# Patient Record
Sex: Male | Born: 1935 | Race: White | Hispanic: No | Marital: Married | Smoking: Never smoker
Health system: Southern US, Community
[De-identification: ages and names within clinical notes are randomized; demographics above are authoritative.]

## PROBLEM LIST (undated history)

## (undated) DIAGNOSIS — M1990 Unspecified osteoarthritis, unspecified site: Secondary | ICD-10-CM

## (undated) DIAGNOSIS — I8390 Asymptomatic varicose veins of unspecified lower extremity: Secondary | ICD-10-CM

## (undated) DIAGNOSIS — C801 Malignant (primary) neoplasm, unspecified: Secondary | ICD-10-CM

## (undated) HISTORY — DX: Malignant (primary) neoplasm, unspecified: C80.1

## (undated) HISTORY — PX: VEIN SURGERY: SHX48

## (undated) HISTORY — DX: Asymptomatic varicose veins of unspecified lower extremity: I83.90

## (undated) HISTORY — DX: Unspecified osteoarthritis, unspecified site: M19.90

---

## 2007-12-22 DIAGNOSIS — C801 Malignant (primary) neoplasm, unspecified: Secondary | ICD-10-CM

## 2007-12-22 HISTORY — DX: Malignant (primary) neoplasm, unspecified: C80.1

## 2008-11-20 ENCOUNTER — Ambulatory Visit: Admission: RE | Admit: 2008-11-20 | Discharge: 2008-11-27 | Payer: Self-pay | Admitting: Radiation Oncology

## 2008-12-17 ENCOUNTER — Inpatient Hospital Stay: Payer: Self-pay | Admitting: General Surgery

## 2008-12-17 ENCOUNTER — Ambulatory Visit: Payer: Self-pay | Admitting: Anesthesiology

## 2008-12-17 HISTORY — PX: APPENDECTOMY: SHX54

## 2008-12-21 HISTORY — PX: VEIN SURGERY: SHX48

## 2009-01-30 ENCOUNTER — Ambulatory Visit: Admission: RE | Admit: 2009-01-30 | Discharge: 2009-03-27 | Payer: Self-pay | Admitting: Radiation Oncology

## 2009-03-08 ENCOUNTER — Encounter: Payer: Self-pay | Admitting: Radiation Oncology

## 2009-03-08 ENCOUNTER — Ambulatory Visit: Payer: Self-pay | Admitting: Vascular Surgery

## 2009-03-08 ENCOUNTER — Ambulatory Visit: Admission: RE | Admit: 2009-03-08 | Discharge: 2009-03-08 | Payer: Self-pay | Admitting: Radiation Oncology

## 2009-03-18 ENCOUNTER — Encounter: Admission: RE | Admit: 2009-03-18 | Discharge: 2009-03-18 | Payer: Self-pay | Admitting: Urology

## 2009-04-01 ENCOUNTER — Ambulatory Visit (HOSPITAL_BASED_OUTPATIENT_CLINIC_OR_DEPARTMENT_OTHER): Admission: RE | Admit: 2009-04-01 | Discharge: 2009-04-01 | Payer: Self-pay | Admitting: Urology

## 2009-04-25 ENCOUNTER — Ambulatory Visit: Admission: RE | Admit: 2009-04-25 | Discharge: 2009-06-14 | Payer: Self-pay | Admitting: Radiation Oncology

## 2010-03-19 ENCOUNTER — Ambulatory Visit: Payer: Self-pay | Admitting: Gastroenterology

## 2011-04-01 LAB — COMPREHENSIVE METABOLIC PANEL
BUN: 13 mg/dL (ref 6–23)
CO2: 26 mEq/L (ref 19–32)
Calcium: 8.7 mg/dL (ref 8.4–10.5)
Creatinine, Ser: 0.89 mg/dL (ref 0.4–1.5)
GFR calc non Af Amer: >60 mL/min (ref >60)
Glucose, Bld: 101 mg/dL — ABNORMAL HIGH (ref 70–99)
Total Bilirubin: 1.2 mg/dL (ref 0.3–1.2)

## 2011-04-01 LAB — CBC
HCT: 42.7 % (ref 39.0–52.0)
Hemoglobin: 14.4 g/dL (ref 13.0–17.0)
MCHC: 33.8 g/dL (ref 30.0–36.0)
MCV: 91.9 fL (ref 78.0–100.0)
RBC: 4.65 MIL/uL (ref 4.22–5.81)

## 2011-04-01 LAB — APTT: aPTT: 34 seconds (ref 24–37)

## 2011-04-01 LAB — PROTIME-INR: Prothrombin Time: 14 seconds (ref 11.6–15.2)

## 2011-05-05 NOTE — Op Note (Signed)
Jeffrey Kemp, Jeffrey Kemp                   ACCOUNT NO.:  [id number]   MEDICAL RECORD NO.:  [id number]          PATIENT TYPE:  AMB   LOCATION:  [facility]                         FACILITY:  [facility]   PHYSICIAN:  Bertram Millard. Dahlstedt, M.D.DATE OF BIRTH:  May 03, 1934   DATE OF PROCEDURE:  04/01/2009  DATE OF DISCHARGE:                               OPERATIVE REPORT   PREOPERATIVE DIAGNOSIS:  Adenocarcinoma of the prostate, clinical stage  T1C, Gleason 4+3.   POSTOPERATIVE DIAGNOSIS:  Adenocarcinoma of the prostate, clinical stage  T1C, Gleason 4+3.   PRINCIPAL PROCEDURE:  I-125 brachytherapy.   SURGEON:  Bertram Millard. Dahlstedt, M.D.   RADIATION ONCOLOGIST:  Artist Pais. Kathrynn Running, M.D.   ANESTHESIA:  General LMA.   COMPLICATIONS:  None.   BRIEF HISTORY:  A 77 year old male with adenocarcinoma of the prostate.  He completed external beam radiotherapy at the end of last month, and  presents at this time for brachytherapy to complete his combination  radiotherapy.  Risks and complications of the procedure have been  discussed with the patient.  He understands these and agrees to proceed.   DESCRIPTION OF PROCEDURE:  The patient was identified in the Holding  Area.  He received preoperative IV antibiotics and was taken to the  operating room, where general anesthetic was administered using the LMA.  He was placed in the dorsal lithotomy position.  Genitalia and perineum  were prepped and draped.  His bladder was catheterized with a Foley  catheter with contrast placed in the balloon.  Transrectal ultrasound  was performed with a probe placed by Dr. Kathrynn Running.  Fixation needles were  placed, following placement of the brachytherapy template against the  perineum.  A rectal catheter was placed as well.  Treatment planning was  performed, directed by Dr. Kathrynn Running.  At this point, I was called into  the room.  Time-out was then called.  Proper procedure, diagnosis,  antibiotics, and anesthesia were discussed.   According to the treatment  plan, a total of 69 seeds were delivered using 28 needles.  For  specifics of the procedure, please see the sonographic planning of  oncology treatment.  Following placement of all needles with their seeds  by the Nucletron device, the rectal probe, the fixation needles, and the  rectal tube were removed.  Fluoroscopic image of the seeds was then  obtained.  The Foley catheter was removed, and cystoscopic evaluation of  the bladder and urethra revealed no trauma or seeds within the urethra,  prostate, or the  bladder.  A very small amount of fluid was left in the bladder.  The  scope was removed.  The procedure was terminated.   The patient tolerated the procedure well.  He was awakened and then  taken to PACU in stable condition.      Bertram Millard. Dahlstedt, M.D.  Electronically Signed     SMD/MEDQ  D:  04/01/2009  T:  04/01/2009  Job:  [id number]   cc:   Artist Pais Kathrynn Running, M.D.  Fax: [phone]

## 2012-04-09 LAB — HM COLONOSCOPY

## 2013-02-06 ENCOUNTER — Ambulatory Visit (INDEPENDENT_AMBULATORY_CARE_PROVIDER_SITE_OTHER): Payer: Medicare Other | Admitting: Internal Medicine

## 2013-02-06 ENCOUNTER — Encounter: Payer: Self-pay | Admitting: Internal Medicine

## 2013-02-06 VITALS — BP 150/70 | HR 74 | Temp 98.6°F | Ht 66.75 in | Wt 189.0 lb

## 2013-02-06 DIAGNOSIS — Z8546 Personal history of malignant neoplasm of prostate: Secondary | ICD-10-CM

## 2013-02-06 DIAGNOSIS — L719 Rosacea, unspecified: Secondary | ICD-10-CM | POA: Insufficient documentation

## 2013-02-06 DIAGNOSIS — Z0000 Encounter for general adult medical examination without abnormal findings: Secondary | ICD-10-CM | POA: Insufficient documentation

## 2013-02-06 LAB — COMPREHENSIVE METABOLIC PANEL
ALT: 20 U/L (ref 0–53)
Alkaline Phosphatase: 91 U/L (ref 39–117)
Creatinine, Ser: 1 mg/dL (ref 0.4–1.5)
Sodium: 137 mEq/L (ref 135–145)
Total Bilirubin: 0.8 mg/dL (ref 0.3–1.2)
Total Protein: 7.6 g/dL (ref 6.0–8.3)

## 2013-02-06 LAB — CBC WITH DIFFERENTIAL/PLATELET
Basophils Absolute: 0 10*3/uL (ref 0.0–0.1)
Eosinophils Absolute: 0.2 10*3/uL (ref 0.0–0.7)
Hemoglobin: 14.9 g/dL (ref 13.0–17.0)
Lymphocytes Relative: 20.4 % (ref 12.0–46.0)
MCHC: 33.9 g/dL (ref 30.0–36.0)
Monocytes Relative: 11.1 % (ref 3.0–12.0)
Neutro Abs: 5 10*3/uL (ref 1.4–7.7)
Neutrophils Relative %: 65.6 % (ref 43.0–77.0)
RBC: 4.82 Mil/uL (ref 4.22–5.81)
RDW: 13.2 % (ref 11.5–14.6)

## 2013-02-06 LAB — LDL CHOLESTEROL, DIRECT: Direct LDL: 141.2 mg/dL

## 2013-02-06 LAB — LIPID PANEL
Cholesterol: 212 mg/dL — ABNORMAL HIGH (ref 0–200)
HDL: 37.1 mg/dL — ABNORMAL LOW (ref >39.00)
Total CHOL/HDL Ratio: 6
VLDL: 27 mg/dL (ref 0.0–40.0)

## 2013-02-06 NOTE — Assessment & Plan Note (Signed)
Will request records on previous evaluation and management.

## 2013-02-06 NOTE — Progress Notes (Signed)
  Subjective:    Patient ID: Jeffrey Kemp, male    DOB: [birth year], 79 y.o.   MRN: [id number]  HPI 79YO male with h/o prostate cancer presents to establish care. Doing well. Only concern today is h/o rosacea. Notes intermittent redness over his cheeks and occasional pustules over his face and neck. Not currently using anything for this. Otherwise feeling well. Follows healthy diet and exercises by gardening.   In regards to h/o prostate cancer, reports last PSA was <0.08. Was treated in past with XRT and seed radiation therapy.  No outpatient encounter prescriptions on file as of 02/06/2013.   No facility-administered encounter medications on file as of 02/06/2013.   BP 150/70  Pulse 74  Temp(Src) 98.6 F (37 C) (Oral)  Ht 5' 6.75" (1.695 m)  Wt 189 lb (85.73 kg)  BMI 29.84 kg/m2  SpO2 98%  Review of Systems  Constitutional: Negative for fever, chills, activity change, appetite change, fatigue and unexpected weight change.  Eyes: Negative for visual disturbance.  Respiratory: Negative for cough and shortness of breath.   Cardiovascular: Negative for chest pain, palpitations and leg swelling.  Gastrointestinal: Negative for abdominal pain and abdominal distention.  Genitourinary: Negative for dysuria, urgency and difficulty urinating.  Musculoskeletal: Negative for arthralgias and gait problem.  Skin: Positive for color change and rash.  Hematological: Negative for adenopathy.  Psychiatric/Behavioral: Negative for sleep disturbance and dysphoric mood. The patient is not nervous/anxious.        Objective:   Physical Exam  Constitutional: He is oriented to person, place, and time. He appears well-developed and well-nourished. No distress.  HENT:  Head: Normocephalic and atraumatic.  Right Ear: External ear normal.  Left Ear: External ear normal.  Nose: Nose normal.  Mouth/Throat: Oropharynx is clear and moist. No oropharyngeal exudate.  Eyes: Conjunctivae and EOM are normal.  Pupils are equal, round, and reactive to light. Right eye exhibits no discharge. Left eye exhibits no discharge. No scleral icterus.  Neck: Normal range of motion. Neck supple. No tracheal deviation present. No thyromegaly present.  Cardiovascular: Normal rate, regular rhythm and normal heart sounds.  Exam reveals no gallop and no friction rub.   No murmur heard. Pulmonary/Chest: Effort normal and breath sounds normal. No respiratory distress. He has no wheezes. He has no rales. He exhibits no tenderness.  Musculoskeletal: Normal range of motion. He exhibits no edema.  Lymphadenopathy:    He has no cervical adenopathy.  Neurological: He is alert and oriented to person, place, and time. No cranial nerve deficit. Coordination normal.  Skin: Skin is warm and dry. Rash noted. Rash is maculopapular. He is not diaphoretic. There is erythema. No pallor.  Psychiatric: He has a normal mood and affect. His behavior is normal. Judgment and thought content normal.          Assessment & Plan:

## 2013-02-06 NOTE — Assessment & Plan Note (Signed)
General medical exam normal today. Will check basic labs including CMP, CBC, lipids. Baseline EKG. Will request records from previous PCP.

## 2013-02-06 NOTE — Assessment & Plan Note (Signed)
Discussed using topical metronidazole cream. Pt declined and would like to just monitor symptoms for now.

## 2013-02-07 NOTE — Progress Notes (Signed)
Patient informed and verbally agreed understanding.  

## 2013-10-09 ENCOUNTER — Ambulatory Visit (INDEPENDENT_AMBULATORY_CARE_PROVIDER_SITE_OTHER): Payer: Medicare Other | Admitting: *Deleted

## 2013-10-09 DIAGNOSIS — Z23 Encounter for immunization: Secondary | ICD-10-CM

## 2013-10-18 ENCOUNTER — Telehealth: Payer: Self-pay | Admitting: Internal Medicine

## 2013-10-18 NOTE — Telephone Encounter (Signed)
Called and spoke with patient, he was a little agitated because he had been hung up on but was apologetic. Per patient he has a swollen painful great toe and he has had this before. Last time it was thought to be gout but per patient it turned out he had a clot. Advised patient to go to the ED to be evaluated patient refused, "I will just deal with it because I am not going to the emergency room". Explained to him that he needs to be evaluated and we can not treat him over the phone as well as it is late in the day. He continued to ask if Dr. Dan Humphreys could give him the same thing he was given before to treat this. He agreed to come in to see Raquel tomorrow in reference to this, because he said he has had all this done before and he knows what it is.

## 2013-10-18 NOTE — Telephone Encounter (Signed)
Pt call his toe on left foot is swollen on side and painful and red.   Pt stated he is taking ibuprofen for pain.  Pt stated last time he had this it was a blood clot in leg. Sent to traige

## 2013-10-18 NOTE — Telephone Encounter (Signed)
Printed and put on carols desk

## 2013-10-19 ENCOUNTER — Encounter: Payer: Self-pay | Admitting: Adult Health

## 2013-10-19 ENCOUNTER — Ambulatory Visit: Payer: Self-pay | Admitting: Adult Health

## 2013-10-19 ENCOUNTER — Other Ambulatory Visit: Payer: Self-pay | Admitting: Adult Health

## 2013-10-19 ENCOUNTER — Ambulatory Visit (INDEPENDENT_AMBULATORY_CARE_PROVIDER_SITE_OTHER): Payer: Medicare Other | Admitting: Adult Health

## 2013-10-19 ENCOUNTER — Encounter (INDEPENDENT_AMBULATORY_CARE_PROVIDER_SITE_OTHER): Payer: Self-pay

## 2013-10-19 ENCOUNTER — Telehealth: Payer: Self-pay | Admitting: *Deleted

## 2013-10-19 VITALS — BP 160/80 | HR 67 | Temp 98.0°F | Resp 12 | Wt 199.0 lb

## 2013-10-19 DIAGNOSIS — R609 Edema, unspecified: Secondary | ICD-10-CM

## 2013-10-19 DIAGNOSIS — R600 Localized edema: Secondary | ICD-10-CM

## 2013-10-19 MED ORDER — MELOXICAM 7.5 MG PO TABS: 7.5000 mg | ORAL_TABLET | Freq: Every day | ORAL | Status: DC

## 2013-10-19 NOTE — Assessment & Plan Note (Signed)
Send for LLE doppler to r/o DVT.

## 2013-10-19 NOTE — Progress Notes (Signed)
  No DVT   Treat for gout - Meloxicam 7.5 mg daily x 14 days.  Elevate foot

## 2013-10-19 NOTE — Progress Notes (Signed)
  Subjective:    Patient ID: JERYL UMHOLTZ, male    DOB: [birth year], 79 y.o.   MRN: [id number]  HPI  Pt presents with redness and swelling in left foot since Sunday. Pt states states he has the same symptoms in 2010 and was told he had a blood clot and was started on Coumadin.  He is requesting evaluation. Denies hx of gout. He reports that, initially, he was treated for gout but it was a DVT.    Review of Systems  Respiratory: Negative.   Cardiovascular: Positive for leg swelling.       Edema of left foot  Skin: Positive for color change.       Redness, left foot       Objective:   Physical Exam  Constitutional: He is oriented to person, place, and time. He appears well-developed and well-nourished. No distress.  Cardiovascular: Normal rate and regular rhythm.   Pulmonary/Chest: Effort normal. No respiratory distress.  Musculoskeletal: He exhibits edema.  Neurological: He is alert and oriented to person, place, and time.  Skin: No rash noted. There is erythema.  Psychiatric: He has a normal mood and affect. His behavior is normal. Judgment and thought content normal.          Assessment & Plan:

## 2013-10-19 NOTE — Telephone Encounter (Signed)
Preliminary report from [facility] called in is Negative for DVT. Patient would like to know plan of treatment if it is Gout.

## 2013-10-20 NOTE — Telephone Encounter (Signed)
PT was notified of normal doppler, advised that Raquel has sent in Meloxicam daily x 14 days, verbalized understanding.

## 2013-10-23 NOTE — Telephone Encounter (Signed)
Advised pt Meloxicam is for gout and to call office if it persists.

## 2013-10-23 NOTE — Telephone Encounter (Signed)
Pt came in today wanting to know what he is being treated for.  He stated he didn't have blood clot. His meds was for anti inflammatory meds

## 2013-11-06 ENCOUNTER — Encounter: Payer: Self-pay | Admitting: Adult Health

## 2013-11-08 ENCOUNTER — Telehealth: Payer: Self-pay | Admitting: Internal Medicine

## 2013-11-08 NOTE — Telephone Encounter (Signed)
Pt states he had gout.  Is asking if there is something he can take for this.  States his toe is still swollen but is better.  Has heard there is something he can take continuously to keep this from happening in the future.  Would like to know his options and prices.

## 2013-11-09 NOTE — Telephone Encounter (Signed)
Please advise 

## 2013-11-09 NOTE — Telephone Encounter (Signed)
Patient informed and appointment scheduled for 11/25 at 830

## 2013-11-09 NOTE — Telephone Encounter (Signed)
Needs to be seen

## 2013-11-14 ENCOUNTER — Ambulatory Visit (INDEPENDENT_AMBULATORY_CARE_PROVIDER_SITE_OTHER): Payer: Medicare Other | Admitting: Internal Medicine

## 2013-11-14 ENCOUNTER — Encounter: Payer: Self-pay | Admitting: Internal Medicine

## 2013-11-14 VITALS — BP 150/68 | Temp 97.7°F

## 2013-11-14 DIAGNOSIS — M25473 Effusion, unspecified ankle: Secondary | ICD-10-CM

## 2013-11-14 DIAGNOSIS — R600 Localized edema: Secondary | ICD-10-CM

## 2013-11-14 DIAGNOSIS — L989 Disorder of the skin and subcutaneous tissue, unspecified: Secondary | ICD-10-CM

## 2013-11-14 DIAGNOSIS — M25472 Effusion, left ankle: Secondary | ICD-10-CM

## 2013-11-14 DIAGNOSIS — R609 Edema, unspecified: Secondary | ICD-10-CM

## 2013-11-14 LAB — COMPREHENSIVE METABOLIC PANEL
ALT: 17 U/L (ref 0–53)
AST: 19 U/L (ref 0–37)
Albumin: 3.3 g/dL — ABNORMAL LOW (ref 3.5–5.2)
Alkaline Phosphatase: 85 U/L (ref 39–117)
BUN: 15 mg/dL (ref 6–23)
Calcium: 8.9 mg/dL (ref 8.4–10.5)
Chloride: 105 mEq/L (ref 96–112)
Potassium: 4.5 mEq/L (ref 3.5–5.1)
Sodium: 138 mEq/L (ref 135–145)
Total Protein: 6.8 g/dL (ref 6.0–8.3)

## 2013-11-14 NOTE — Progress Notes (Signed)
  Subjective:    Patient ID: Jeffrey Kemp, male    DOB: Sep 07, 1934, 79 y.o.   MRN: [id number]  HPI 79YO male presents for follow up recent left ankle pain and swelling. First developed left ankle and foot pain about 3-4 weeks ago. Was seen in clinic, concern for DVT, so had LE doppler which was negative. Pain persisted and he went to urgent care. Started on Prednisone for possible gout. Notes improvement in the pain since that time. No labs were checked. Also notes skin lesion over medial left ankle, which has been present for >1 year. The skin lesion is not painful or itchy. It has not changed in size  Outpatient Encounter Prescriptions as of 11/14/2013  Medication Sig  . meloxicam (MOBIC) 7.5 MG tablet Take 1 tablet (7.5 mg total) by mouth daily.   BP 150/68  Temp(Src) 97.7 F (36.5 C)  Review of Systems  Constitutional: Negative for fever and chills.  Respiratory: Negative for shortness of breath.   Cardiovascular: Positive for leg swelling. Negative for chest pain.  Musculoskeletal: Positive for arthralgias and joint swelling. Negative for gait problem.  Skin: Positive for color change (initially had erythema over left foot and ankle, now improving.) and wound (chronic left medial ankle).  Neurological: Negative for weakness.       Objective:   Physical Exam  Constitutional: He is oriented to person, place, and time. He appears well-developed and well-nourished. No distress.  HENT:  Head: Normocephalic and atraumatic.  Right Ear: External ear normal.  Left Ear: External ear normal.  Nose: Nose normal.  Mouth/Throat: Oropharynx is clear and moist. No oropharyngeal exudate.  Eyes: Conjunctivae and EOM are normal. Pupils are equal, round, and reactive to light. Right eye exhibits no discharge. Left eye exhibits no discharge. No scleral icterus.  Neck: Normal range of motion. Neck supple. No tracheal deviation present. No thyromegaly present.  Cardiovascular: Normal rate, regular  rhythm and normal heart sounds.  Exam reveals no gallop and no friction rub.   No murmur heard. Pulmonary/Chest: Effort normal and breath sounds normal. No respiratory distress. He has no wheezes. He has no rales. He exhibits no tenderness.  Musculoskeletal: Normal range of motion. He exhibits no edema.       Left foot: He exhibits tenderness (mild diffuse over ankle and great toe) and swelling (trace left ankle). He exhibits normal range of motion and no bony tenderness.       Feet:  Lymphadenopathy:    He has no cervical adenopathy.  Neurological: He is alert and oriented to person, place, and time. No cranial nerve deficit. Coordination normal.  Skin: Skin is warm and dry. No rash noted. He is not diaphoretic. No erythema. No pallor.  Psychiatric: He has a normal mood and affect. His behavior is normal. Judgment and thought content normal.          Assessment & Plan:

## 2013-11-14 NOTE — Assessment & Plan Note (Addendum)
Persistent, but improving left ankle and foot pain and swelling. LE was negative for DVT. Pt was seen at urgent care and started on Prednisone with improvement. Will check CMP and uric acid level today. Consider adding allopurinol in the future. For now, pain described as minimal and pt does not want to add any additional medication. Given persistent left ankle pain, will also check plain xray. Follow up 2 weeks.

## 2013-11-14 NOTE — Assessment & Plan Note (Signed)
Lesion is most consistent with chronic ulceration secondary to edema however given persistence, will set up dermatology evaluation for possible biopsy.

## 2013-11-20 ENCOUNTER — Telehealth: Payer: Self-pay | Admitting: *Deleted

## 2013-11-20 NOTE — Telephone Encounter (Signed)
Patient called wanting to know if you received the results from his visit with Dr. Gwen Pounds. He saw him today and would like to know if he should still have the x-ray done tomorrow. Informed patient Dr. Dan Humphreys was not in the office this afternoon and it would be tomorrow before she could check visit notes. Per patient he will go ahead get the Xray done.

## 2013-11-20 NOTE — Telephone Encounter (Signed)
I have not yet seen notes from Dr. Gwen Pounds

## 2013-11-21 ENCOUNTER — Ambulatory Visit (INDEPENDENT_AMBULATORY_CARE_PROVIDER_SITE_OTHER)
Admission: RE | Admit: 2013-11-21 | Discharge: 2013-11-21 | Disposition: A | Discharge status: Home / Self Care | Payer: Medicare Other | Source: Ambulatory Visit | Attending: Internal Medicine | Admitting: Internal Medicine

## 2013-11-21 DIAGNOSIS — M25473 Effusion, unspecified ankle: Secondary | ICD-10-CM

## 2013-11-21 DIAGNOSIS — M25472 Effusion, left ankle: Secondary | ICD-10-CM

## 2013-11-21 NOTE — Telephone Encounter (Signed)
No I have not

## 2013-11-21 NOTE — Telephone Encounter (Signed)
Spoke with patient and he would prefer to wait 6 weeks with the plan of Dr. Gwen Pounds. Because Dr. Gwen Pounds also told him if that does not work then he should be seen by vascular.

## 2013-11-21 NOTE — Telephone Encounter (Signed)
Have you received the notes?

## 2013-11-21 NOTE — Telephone Encounter (Signed)
OK. We can always set up a vascular evaluation with doppler if needed.

## 2013-11-21 NOTE — Telephone Encounter (Signed)
Called and spoke with patient, he state his leg is still swollen. But when he saw Dr. Gwen Pounds yesterday he told him he has vascular problems that is why he is having swelling in his leg. He is going to have him wear compression stockings and aspirin for 6 weeks to see how that does.

## 2013-12-04 ENCOUNTER — Telehealth: Payer: Self-pay | Admitting: *Deleted

## 2013-12-04 NOTE — Telephone Encounter (Signed)
Patient left a voicemail stating he had a missed call from the office and he was returning the call. Called and spoke with patient, informed him I did not see anything in his chart about someone calling him. However an automated system is calling patients to remind them about some overdue health maintenance such as immunizations and colonoscopy. Per patient he will address this when he comes in for his yearly exam at the beginning of the year.

## 2014-01-26 ENCOUNTER — Ambulatory Visit (INDEPENDENT_AMBULATORY_CARE_PROVIDER_SITE_OTHER): Payer: Medicare HMO | Admitting: Internal Medicine

## 2014-01-26 ENCOUNTER — Encounter (INDEPENDENT_AMBULATORY_CARE_PROVIDER_SITE_OTHER): Payer: Self-pay

## 2014-01-26 ENCOUNTER — Encounter: Payer: Self-pay | Admitting: Internal Medicine

## 2014-01-26 VITALS — BP 144/78 | HR 78 | Temp 98.1°F | Wt 201.0 lb

## 2014-01-26 DIAGNOSIS — R1031 Right lower quadrant pain: Secondary | ICD-10-CM

## 2014-01-26 DIAGNOSIS — I739 Peripheral vascular disease, unspecified: Secondary | ICD-10-CM

## 2014-01-26 NOTE — Progress Notes (Signed)
   Subjective:    Patient ID: Jeffrey Kemp, male    DOB: [birth year], 80 y.o.   MRN: [id number]  HPI 80 year old male presents for followup of left foot pain. He reports persistent pain in his left foot and occasional discoloration of his foot described as purplish color. He notes that his foot occasionally feels cold. When he walks for a prolonged period of time he develops cramping and pain in his left calf. He denies swelling in his leg.  He is also concerned about pain in his right groin. This is been ongoing for several weeks. It is described as intermittent and sharp pain associated with movement. He has not noticed any bulging or mass in the area. He denies trauma to his groin.  Review of Systems  Constitutional: Negative for fever, chills, activity change, appetite change, fatigue and unexpected weight change.  Eyes: Negative for visual disturbance.  Respiratory: Negative for cough and shortness of breath.   Cardiovascular: Negative for chest pain, palpitations and leg swelling.  Gastrointestinal: Positive for abdominal pain (right groin pain). Negative for nausea, vomiting, diarrhea, constipation and abdominal distention.  Genitourinary: Negative for dysuria, urgency, frequency, hematuria, flank pain, penile swelling, scrotal swelling, difficulty urinating, genital sores, penile pain and testicular pain.  Musculoskeletal: Positive for arthralgias and myalgias. Negative for gait problem.  Skin: Positive for color change. Negative for rash.  Hematological: Negative for adenopathy.  Psychiatric/Behavioral: Negative for sleep disturbance and dysphoric mood. The patient is not nervous/anxious.        Objective:    BP 144/78  Pulse 78  Temp(Src) 98.1 F (36.7 C) (Oral)  Wt 201 lb (91.173 kg)  SpO2 97% Physical Exam  Constitutional: He is oriented to person, place, and time. He appears well-developed and well-nourished. No distress.  HENT:  Head: Normocephalic and atraumatic.  Right  Ear: External ear normal.  Left Ear: External ear normal.  Nose: Nose normal.  Mouth/Throat: Oropharynx is clear and moist. No oropharyngeal exudate.  Eyes: Conjunctivae and EOM are normal. Pupils are equal, round, and reactive to light. Right eye exhibits no discharge. Left eye exhibits no discharge. No scleral icterus.  Neck: Normal range of motion. Neck supple. No tracheal deviation present. No thyromegaly present.  Cardiovascular: Normal rate, regular rhythm and normal heart sounds.  Exam reveals no gallop and no friction rub.   No murmur heard. Pulmonary/Chest: Effort normal and breath sounds normal. No accessory muscle usage. Not tachypneic. No respiratory distress. He has no decreased breath sounds. He has no wheezes. He has no rhonchi. He has no rales. He exhibits no tenderness.  Abdominal: Soft. Normal appearance and bowel sounds are normal. There is tenderness.    Musculoskeletal: Normal range of motion. He exhibits no edema.       Feet:  Lymphadenopathy:    He has no cervical adenopathy.  Neurological: He is alert and oriented to person, place, and time. No cranial nerve deficit. Coordination normal.  Skin: Skin is warm and dry. No rash noted. He is not diaphoretic. No erythema. No pallor.  Psychiatric: He has a normal mood and affect. His behavior is normal. Judgment and thought content normal.          Assessment & Plan:   Problem List Items Addressed This Visit   None       No Follow-up on file.

## 2014-01-26 NOTE — Assessment & Plan Note (Signed)
Symptoms in left foot and leg are most consistent with claudication. Will set up vascular evaluation for ABI and arterial Doppler. Continue aspirin.

## 2014-01-26 NOTE — Progress Notes (Signed)
Pre-visit discussion using our clinic review tool. No additional management support is needed unless otherwise documented below in the visit note.  

## 2014-01-26 NOTE — Assessment & Plan Note (Signed)
Right groin pain most consistent with right inguinal hernia. Will set up evaluation with general surgery.

## 2014-01-29 ENCOUNTER — Encounter: Payer: Self-pay | Admitting: *Deleted

## 2014-02-01 ENCOUNTER — Ambulatory Visit (INDEPENDENT_AMBULATORY_CARE_PROVIDER_SITE_OTHER): Payer: Medicare HMO | Admitting: General Surgery

## 2014-02-01 ENCOUNTER — Encounter: Payer: Self-pay | Admitting: General Surgery

## 2014-02-01 ENCOUNTER — Ambulatory Visit: Payer: Self-pay | Admitting: General Surgery

## 2014-02-01 VITALS — BP 174/84 | HR 74 | Resp 12 | Ht 69.0 in | Wt 202.0 lb

## 2014-02-01 DIAGNOSIS — R1031 Right lower quadrant pain: Secondary | ICD-10-CM

## 2014-02-01 DIAGNOSIS — M7989 Other specified soft tissue disorders: Secondary | ICD-10-CM

## 2014-02-01 MED ORDER — MELOXICAM 7.5 MG PO TABS: 7.5000 mg | ORAL_TABLET | Freq: Every day | ORAL | Status: DC

## 2014-02-01 NOTE — Patient Instructions (Signed)
Rx for Mobic 7.5mg  daily for 1-2 weeks. If therapy is unsuccessful for 1 week, CT scan.  Patient also to return for duplex study of lower extremity veins.

## 2014-02-01 NOTE — Progress Notes (Signed)
Patient ID: Jeffrey Kemp, male   DOB: [birth year], 80 y.o.   MRN: [id number]  Chief Complaint  Patient presents with  . Other    right groin pain    HPI Jeffrey Kemp is a 80 y.o. male here today for an evaluation of right groin pain that began 6 months ago. Pain has began to increase in intensity in the past two weeks.  Patient states the pain is intermittent.  HPI  Past Medical History  Diagnosis Date  . Cancer 2009    Prostate, s/p XRT    Past Surgical History  Procedure Laterality Date  . Vein surgery    . Appendectomy  12/17/2008    Jeffrey    Family History  Problem Relation Age of Onset  . Heart disease Mother   . Heart disease Brother   . Cancer Brother     Colon  . Bipolar disorder Daughter   . Heart disease Maternal Uncle     Social History History  Substance Use Topics  . Smoking status: Never Smoker   . Smokeless tobacco: Never Used  . Alcohol Use: No    No Known Allergies  Current Outpatient Prescriptions  Medication Sig Dispense Refill  . aspirin 81 MG tablet Take 81 mg by mouth daily.      . meloxicam (MOBIC) 7.5 MG tablet Take 1 tablet (7.5 mg total) by mouth daily.  30 tablet  0   No current facility-administered medications for this visit.    Review of Systems Review of Systems  Constitutional: Negative.   Respiratory: Negative.   Cardiovascular: Negative.     Blood pressure 174/84, pulse 74, resp. rate 12, height 5\' 9"  (1.753 m), weight 202 lb (91.627 kg).  Physical Exam Physical Exam  Constitutional: He is oriented to person, place, and time. He appears well-developed and well-nourished.  Eyes: Conjunctivae are normal.  Cardiovascular: Normal rate, regular rhythm and normal heart sounds.   Pulses:      Dorsalis pedis pulses are 2+ on the right side, and 0 on the left side.       Posterior tibial pulses are 2+ on the right side, and 2+ on the left side.  1+ edema bilaterally. Feet are warm with brisk capillary refill. Left big toe with  no apparent findings.  Pulmonary/Chest: Effort normal and breath sounds normal.  Abdominal: Soft. Normal appearance. There is tenderness (right inguinal region). No hernia.  Neurological: He is alert and oriented to person, place, and time.  Skin: Skin is warm and dry.    Data Reviewed None  Assessment    No hernia noted in the right groin.  Mild edema in legs in lower third. No apparent cause. No arterial insufficiency noted.     Plan    Rx for Mobic 7.5mg  daily for 1-2 weeks. If therapy is unsuccessful for 1 week, CT scan.  Patient also to return for duplex study of lower extremity veins.        Jeffrey,Kemp G 02/02/2014, 8:29 AM

## 2014-02-02 ENCOUNTER — Encounter: Payer: Self-pay | Admitting: General Surgery

## 2014-02-20 ENCOUNTER — Encounter: Payer: Medicare Other | Admitting: Internal Medicine

## 2014-02-22 ENCOUNTER — Encounter: Payer: Self-pay | Admitting: General Surgery

## 2014-02-22 ENCOUNTER — Ambulatory Visit (INDEPENDENT_AMBULATORY_CARE_PROVIDER_SITE_OTHER): Payer: Medicare HMO | Admitting: General Surgery

## 2014-02-22 VITALS — BP 154/68 | HR 78 | Resp 14 | Ht 69.0 in | Wt 200.0 lb

## 2014-02-22 DIAGNOSIS — R600 Localized edema: Secondary | ICD-10-CM

## 2014-02-22 DIAGNOSIS — R609 Edema, unspecified: Secondary | ICD-10-CM

## 2014-02-22 NOTE — Patient Instructions (Signed)
Patient advised to continue to use compression stockings. Patient to return as needed. The patient is aware to call back for any questions or concerns.

## 2014-02-22 NOTE — Progress Notes (Addendum)
Patient ID: Jeffrey Kemp, male   DOB: [birth year], 80 y.o.   MRN: [id number]  Patient presents for a venous ultrasound. The patient has a history of varicose veins. He states he wears his compression stocking on a daily basis. No new problems at this time. The patient was seen two weeks ago for right groin pain.  He states the groin pain is improved.  Exam shows again no hernia in right groin, no lymphadenopathy. Venous duplex study was completed today.  Has mild insufficiency of left femoral vein. Pt advised. He is to continue using compression hose. Call for any worsening of edema or leg symptoms.  Also call if his groin pain recurs or worsens.  Lower extremity venous Duplex study. Bilateral lower extremity venous duplex study was completed today. The greater saphenous vein is not identified on the right or left. The lesser saphenous vein is normal on both sides. The right common femoral vein and femoral vein are normal. The left common femoral vein has a mild abnormal reflux 0.9 seconds. Both popliteal veins are normal Impression: Mild deep venous insufficiency involving the left common femoral vein.

## 2014-02-23 ENCOUNTER — Encounter: Payer: Self-pay | Admitting: General Surgery

## 2014-02-23 DIAGNOSIS — R600 Localized edema: Secondary | ICD-10-CM | POA: Insufficient documentation

## 2014-03-02 ENCOUNTER — Other Ambulatory Visit: Payer: Self-pay | Admitting: *Deleted

## 2014-03-02 ENCOUNTER — Other Ambulatory Visit: Payer: Medicare HMO

## 2014-03-02 DIAGNOSIS — I872 Venous insufficiency (chronic) (peripheral): Secondary | ICD-10-CM

## 2014-03-07 ENCOUNTER — Telehealth: Payer: Self-pay | Admitting: Emergency Medicine

## 2014-03-07 NOTE — Telephone Encounter (Signed)
Referral underway for [employer] to [facility]

## 2014-03-13 NOTE — Telephone Encounter (Signed)
No referral needed as [facility] is [facility] per [facility]

## 2014-04-09 ENCOUNTER — Ambulatory Visit (INDEPENDENT_AMBULATORY_CARE_PROVIDER_SITE_OTHER): Payer: Commercial Managed Care - HMO | Admitting: Internal Medicine

## 2014-04-09 ENCOUNTER — Encounter: Payer: Self-pay | Admitting: Internal Medicine

## 2014-04-09 VITALS — BP 150/80 | HR 68 | Temp 98.1°F | Ht 67.25 in | Wt 201.0 lb

## 2014-04-09 DIAGNOSIS — L719 Rosacea, unspecified: Secondary | ICD-10-CM

## 2014-04-09 DIAGNOSIS — Z23 Encounter for immunization: Secondary | ICD-10-CM | POA: Insufficient documentation

## 2014-04-09 DIAGNOSIS — M2550 Pain in unspecified joint: Secondary | ICD-10-CM

## 2014-04-09 DIAGNOSIS — G4700 Insomnia, unspecified: Secondary | ICD-10-CM

## 2014-04-09 DIAGNOSIS — Z0000 Encounter for general adult medical examination without abnormal findings: Secondary | ICD-10-CM | POA: Insufficient documentation

## 2014-04-09 LAB — CBC WITH DIFFERENTIAL/PLATELET
Basophils Absolute: 0 10*3/uL (ref 0.0–0.1)
Basophils Relative: 0.7 % (ref 0.0–3.0)
EOS PCT: 2.7 % (ref 0.0–5.0)
Eosinophils Absolute: 0.2 10*3/uL (ref 0.0–0.7)
HCT: 41.4 % (ref 39.0–52.0)
Hemoglobin: 13.9 g/dL (ref 13.0–17.0)
Lymphocytes Relative: 23.1 % (ref 12.0–46.0)
Lymphs Abs: 1.4 10*3/uL (ref 0.7–4.0)
MCHC: 33.7 g/dL (ref 30.0–36.0)
MCV: 92.2 fl (ref 78.0–100.0)
MONO ABS: 0.7 10*3/uL (ref 0.1–1.0)
MONOS PCT: 11.7 % (ref 3.0–12.0)
NEUTROS PCT: 61.8 % (ref 43.0–77.0)
Neutro Abs: 3.7 10*3/uL (ref 1.4–7.7)
PLATELETS: 234 10*3/uL (ref 150.0–400.0)
RBC: 4.49 Mil/uL (ref 4.22–5.81)
RDW: 13.6 % (ref 11.5–14.6)
WBC: 6 10*3/uL (ref 4.5–10.5)

## 2014-04-09 LAB — PSA, MEDICARE: PSA: 0.01 ng/mL — AB (ref 0.10–4.00)

## 2014-04-09 LAB — COMPREHENSIVE METABOLIC PANEL
ALBUMIN: 3.3 g/dL — AB (ref 3.5–5.2)
ALK PHOS: 78 U/L (ref 39–117)
ALT: 17 U/L (ref 0–53)
AST: 64 U/L — ABNORMAL HIGH (ref 0–37)
BUN: 16 mg/dL (ref 6–23)
CO2: 26 meq/L (ref 19–32)
Calcium: 9 mg/dL (ref 8.4–10.5)
Chloride: 105 mEq/L (ref 96–112)
Creatinine, Ser: 0.9 mg/dL (ref 0.4–1.5)
GFR: 89 mL/min (ref >60.00)
GLUCOSE: 92 mg/dL (ref 70–99)
POTASSIUM: 4.6 meq/L (ref 3.5–5.1)
SODIUM: 138 meq/L (ref 135–145)
TOTAL PROTEIN: 6.7 g/dL (ref 6.0–8.3)
Total Bilirubin: 0.9 mg/dL (ref 0.3–1.2)

## 2014-04-09 LAB — MICROALBUMIN / CREATININE URINE RATIO
CREATININE, U: 159.5 mg/dL
MICROALB UR: 0.5 mg/dL (ref 0.0–1.9)
MICROALB/CREAT RATIO: 0.3 mg/g (ref 0.0–30.0)

## 2014-04-09 LAB — LIPID PANEL
CHOLESTEROL: 203 mg/dL — AB (ref 0–200)
HDL: 35.1 mg/dL — ABNORMAL LOW (ref >39.00)
LDL CALC: 132 mg/dL — AB (ref 0–99)
Total CHOL/HDL Ratio: 6
Triglycerides: 181 mg/dL — ABNORMAL HIGH (ref 0.0–149.0)
VLDL: 36.2 mg/dL (ref 0.0–40.0)

## 2014-04-09 MED ORDER — METRONIDAZOLE 1 % EX GEL: Freq: Every day | CUTANEOUS | Status: DC

## 2014-04-09 MED ORDER — MELOXICAM 15 MG PO TABS: 15.0000 mg | ORAL_TABLET | Freq: Every day | ORAL | Status: DC

## 2014-04-09 MED ORDER — ZALEPLON 5 MG PO CAPS: 5.0000 mg | ORAL_CAPSULE | Freq: Every evening | ORAL | Status: DC | PRN

## 2014-04-09 NOTE — Progress Notes (Signed)
Pre visit review using our clinic review tool, if applicable. No additional management support is needed unless otherwise documented below in the visit note. 

## 2014-04-09 NOTE — Progress Notes (Signed)
The patient is here for annual Medicare Wellness Examination and management of other chronic and acute problems.   The risk factors are reflected in the history.  The roster of all physicians providing medical care to patient - is listed in the Snapshot section of the chart.  Activities of daily living:   The patient is 100% independent in all ADLs: dressing, toileting, feeding as well as independent mobility. Patient lives with wife. No pets. One story.  Home safety :  The patient has smoke detectors in the home.  They wear seatbelts in their car. There are no firearms at home.  There is no violence in the home. They feel safe where they live.  Infectious Risks: There is no risks for hepatitis, STDs or HIV.  There is no  history of blood transfusion.  They have no travel history to infectious disease endemic areas of the world.  Additional Health Care Providers: The patient has seen their dentist in the last six months.  [facility] - [facility] They have seen their eye doctor in the last year. Scheduled for cataract removal in May and June. Opthalmologist - Dr. Linton Flemings They deny hearing issues. They have deferred audiologic testing in the last year.   They do not  have excessive sun exposure. Discussed the need for sun protection: hats,long sleeves and use of sunscreen if there is significant sun exposure.  Dermatologist - Dr. Nehemiah Massed  Diet: the importance of a healthy diet is discussed. They do have a healthy diet.  The benefits of regular aerobic exercise were discussed. Patient is physically active, but no scheduled exercise.  Depression screen: there are no signs or vegative symptoms of depression- irritability, change in appetite, anhedonia, sadness/tearfullness. Notes occasional depressed mood recently, but attributes this to inability to see well, with bilateral cataracts, diffuse joint pain, and trouble sleeping. Previously enjoyed watching baseball on TV, but has been unable  to recently.  Cognitive assessment: the patient manages all their financial and personal affairs and is actively engaged.   HCPOA - wife, Joaquin Knebel   The following portions of the patient's history were reviewed and updated as appropriate: allergies, current medications, past family history, past medical history,  past surgical history, past social history and problem list.  Visual acuity was not assessed per patient preference as they have regular follow up with their ophthalmologist. Hearing and body mass index were assessed and reviewed.   During the course of the visit the patient was educated and counseled about appropriate screening and preventive services including : fall prevention , diabetes screening, nutrition counseling, colorectal cancer screening, and recommended immunizations.    Concerned about diffuse arthralgia, particulary in right ankle. Takes occasional ibuprofen with no improvement.  Also not sleeping well. Falls asleep but then wakes 2-3 hr later and cannot go back to sleep.  Review of Systems  Constitutional: Negative for fever, chills, activity change, appetite change, fatigue and unexpected weight change.  Eyes: Negative for visual disturbance.  Respiratory: Negative for cough and shortness of breath.   Cardiovascular: Negative for chest pain, palpitations and leg swelling.  Gastrointestinal: Negative for abdominal pain and abdominal distention.  Genitourinary: Negative for dysuria, urgency and difficulty urinating.  Musculoskeletal: Positive for arthralgias and joint swelling. Negative for gait problem.  Skin: Negative for color change and rash.  Hematological: Negative for adenopathy.  Psychiatric/Behavioral: Positive for sleep disturbance and dysphoric mood. The patient is not nervous/anxious.        Objective:    BP 150/80  Pulse 68  Temp(Src) 98.1 F (36.7 C) (Oral)  Ht 5' 7.25" (1.708 m)  Wt 201 lb (91.173 kg)  BMI 31.25 kg/m2  SpO2  97% Physical Exam  Constitutional: He is oriented to person, place, and time. He appears well-developed and well-nourished. No distress.  HENT:  Head: Normocephalic and atraumatic.  Right Ear: External ear normal.  Left Ear: External ear normal.  Nose: Nose normal.  Mouth/Throat: Oropharynx is clear and moist. No oropharyngeal exudate.  Eyes: Conjunctivae and EOM are normal. Pupils are equal, round, and reactive to light. Right eye exhibits no discharge. Left eye exhibits no discharge. No scleral icterus.  Neck: Normal range of motion. Neck supple. No tracheal deviation present. No thyromegaly present.  Cardiovascular: Normal rate, regular rhythm and normal heart sounds.  Exam reveals no gallop and no friction rub.   No murmur heard. Pulmonary/Chest: Effort normal and breath sounds normal. No accessory muscle usage. Not tachypneic. No respiratory distress. He has no decreased breath sounds. He has no wheezes. He has no rhonchi. He has no rales. He exhibits no tenderness.  Abdominal: Soft. Bowel sounds are normal. There is no tenderness.  Musculoskeletal: Normal range of motion. He exhibits no edema.       Left ankle: He exhibits swelling (non-pitting edema noted over ankle joint). He exhibits normal range of motion and no deformity. No tenderness.  Lymphadenopathy:    He has no cervical adenopathy.  Neurological: He is alert and oriented to person, place, and time. No cranial nerve deficit. Coordination normal.  Skin: Skin is warm and dry. No rash noted. He is not diaphoretic. No erythema. No pallor.  Psychiatric: He has a normal mood and affect. His behavior is normal. Judgment and thought content normal.          Assessment & Plan:   Problem List Items Addressed This Visit   Arthralgia     Diffuse arthralgia most prominent in left ankle. Most consistent with OA. Reviewed recent plain film of left ankle which was normal. Will restart Meloxicam as this worked well for him in the past.  Discussed referral to rheumatology if symptoms are not improving.    Relevant Medications      meloxicam (MOBIC) tablet   Insomnia     Recent difficulty staying asleep. Will start Sonata 5mg  daily prn. He will call if no improvement with this medication.    Relevant Medications      zaleplon (SONATA) capsule   Medicare annual wellness visit, subsequent - Primary     General medical exam normal today except as noted. Encouraged healthy diet and regular exercise. Note mild symptoms of depressed mood, however this seems to be related to uncontrolled arthralgia and insomnia, which were addressed. Plan to bring pt back to recheck in 2-4 weeks. Prevnar given today.    Relevant Orders      PSA, Medicare      CBC with Differential      Comprehensive metabolic panel      Lipid panel      Microalbumin / creatinine urine ratio   Need for prophylactic vaccination against Streptococcus pneumoniae (pneumococcus)   Relevant Orders      Pneumococcal conjugate vaccine 13-valent (Completed)   Rosacea     Recent worsening symptoms of rosacea. Will start Metrogel bid. Follow up 4 weeks to recheck.    Relevant Medications      metroNIDAZOLE (METROGEL) 1 % gel       Return in about 4 weeks (around 05/07/2014)  for Recheck.

## 2014-04-09 NOTE — Assessment & Plan Note (Signed)
Recent difficulty staying asleep. Will start Sonata 5mg  daily prn. He will call if no improvement with this medication.

## 2014-04-09 NOTE — Patient Instructions (Signed)
Start Sonata 5mg  at bedtime. Call if symptoms are not improving.  Start Meloxicam to help with joint pain.  Follow up in 4 weeks.

## 2014-04-09 NOTE — Assessment & Plan Note (Signed)
Diffuse arthralgia most prominent in left ankle. Most consistent with OA. Reviewed recent plain film of left ankle which was normal. Will restart Meloxicam as this worked well for him in the past. Discussed referral to rheumatology if symptoms are not improving.

## 2014-04-09 NOTE — Assessment & Plan Note (Signed)
General medical exam normal today except as noted. Encouraged healthy diet and regular exercise. Note mild symptoms of depressed mood, however this seems to be related to uncontrolled arthralgia and insomnia, which were addressed. Plan to bring pt back to recheck in 2-4 weeks. Prevnar given today.

## 2014-04-09 NOTE — Assessment & Plan Note (Signed)
Recent worsening symptoms of rosacea. Will start Metrogel bid. Follow up 4 weeks to recheck.

## 2014-04-18 ENCOUNTER — Ambulatory Visit: Payer: Self-pay | Admitting: Ophthalmology

## 2014-04-18 DIAGNOSIS — I499 Cardiac arrhythmia, unspecified: Secondary | ICD-10-CM

## 2014-04-18 DIAGNOSIS — Z01810 Encounter for preprocedural cardiovascular examination: Secondary | ICD-10-CM

## 2014-04-19 ENCOUNTER — Telehealth: Payer: Self-pay | Admitting: Internal Medicine

## 2014-04-19 NOTE — Telephone Encounter (Signed)
I would recommend that he try increasing the dose on the Sonata to 10mg  daily. This would be two of the pills that he is taking now. If symptoms are better controlled then on 10mg  at bedtime, then we can call in new Rx for Sonata 10mg  po qhs disp 30 with 3 refills.

## 2014-04-19 NOTE — Telephone Encounter (Signed)
Pt notified and verbalized understanding. Will call back after using the 10 mg for a few nights and advised we would send in new dose if it is working better.

## 2014-04-19 NOTE — Telephone Encounter (Signed)
Pt has been using the Sonata Dr. Gilford Rile prescribed. It worked well the first 2 nights, but has not since. He can fall asleep but then wakes 2 hours later and can not return to sleep easily. Advised Dr. Gilford Rile is out of the office until Monday, offered to send message to another doctor, but pt states that is ok, is not urgent, and will wait for a response from her.

## 2014-04-19 NOTE — Telephone Encounter (Signed)
States he was prescribed medication for insomnia and was told to call if it was not helping.  States it is not working.  Asking for a call.

## 2014-04-25 HISTORY — PX: CATARACT EXTRACTION: SUR2

## 2014-05-01 ENCOUNTER — Ambulatory Visit: Payer: Self-pay | Admitting: Ophthalmology

## 2014-05-08 ENCOUNTER — Ambulatory Visit (INDEPENDENT_AMBULATORY_CARE_PROVIDER_SITE_OTHER): Payer: Commercial Managed Care - HMO | Admitting: Internal Medicine

## 2014-05-08 ENCOUNTER — Encounter: Payer: Self-pay | Admitting: Internal Medicine

## 2014-05-08 VITALS — BP 144/80 | HR 69 | Temp 98.3°F | Wt 200.5 lb

## 2014-05-08 DIAGNOSIS — R945 Abnormal results of liver function studies: Secondary | ICD-10-CM

## 2014-05-08 DIAGNOSIS — M2550 Pain in unspecified joint: Secondary | ICD-10-CM

## 2014-05-08 DIAGNOSIS — R7989 Other specified abnormal findings of blood chemistry: Secondary | ICD-10-CM | POA: Insufficient documentation

## 2014-05-08 DIAGNOSIS — G4700 Insomnia, unspecified: Secondary | ICD-10-CM

## 2014-05-08 LAB — COMPREHENSIVE METABOLIC PANEL
ALK PHOS: 66 U/L (ref 39–117)
ALT: 16 U/L (ref 0–53)
AST: 63 U/L — AB (ref 0–37)
Albumin: 3.7 g/dL (ref 3.5–5.2)
BILIRUBIN TOTAL: 0.8 mg/dL (ref 0.2–1.2)
BUN: 16 mg/dL (ref 6–23)
CALCIUM: 8.9 mg/dL (ref 8.4–10.5)
CHLORIDE: 106 meq/L (ref 96–112)
CO2: 26 mEq/L (ref 19–32)
CREATININE: 1 mg/dL (ref 0.4–1.5)
GFR: 79.52 mL/min (ref >60.00)
Glucose, Bld: 93 mg/dL (ref 70–99)
Potassium: 4.1 mEq/L (ref 3.5–5.1)
Sodium: 138 mEq/L (ref 135–145)
Total Protein: 7.1 g/dL (ref 6.0–8.3)

## 2014-05-08 MED ORDER — ZOLPIDEM TARTRATE 5 MG PO TABS: 5.0000 mg | ORAL_TABLET | Freq: Every evening | ORAL | Status: DC | PRN

## 2014-05-08 NOTE — Assessment & Plan Note (Signed)
Elevated AST noted on recent labs. Will repeat LFTs with labs today.

## 2014-05-08 NOTE — Assessment & Plan Note (Signed)
Symptoms poorly controlled with Sonata. Will change to Ambien 5mg  at bedtime. Discussed potential risks of this medication and appropriate use. Follow up 4 weeks and prn.

## 2014-05-08 NOTE — Patient Instructions (Signed)
Start Ambien 5mg  at bedtime to help with insomnia.  Follow up in 4 weeks or sooner as needed.

## 2014-05-08 NOTE — Progress Notes (Signed)
Pre visit review using our clinic review tool, if applicable. No additional management support is needed unless otherwise documented below in the visit note. 

## 2014-05-08 NOTE — Assessment & Plan Note (Signed)
Symptoms improved with use of Meloxicam. Will continue. Discussed potential side effects including gastritis and ulceration. Encouraged him to take the medication with food.

## 2014-05-08 NOTE — Progress Notes (Signed)
Subjective:    Patient ID: Jeffrey Kemp, male    DOB: [birth year], 80 y.o.   MRN: [id number]  HPI 80YO male presents for follow up.  Insomnia - No improvement with Sonata. Typically falls asleep around 10:30, then only sleeps 2-3 hours and is wide awake. Feeling okay during the day.  Arthralgia - Meloxicam has helped with joint pain. No side effects noted. Taking medication with food.  Reviewed recent labs with pt today. Noted LFTs, AST elevated at 64. Pt does not drink alcohol. No supplemental meds. Discussed plan to repeat AST with labs today.  Review of Systems  Constitutional: Negative for fever, chills, activity change, appetite change, fatigue and unexpected weight change.  Eyes: Negative for visual disturbance.  Respiratory: Negative for cough and shortness of breath.   Cardiovascular: Negative for chest pain, palpitations and leg swelling.  Gastrointestinal: Negative for abdominal pain and abdominal distention.  Genitourinary: Negative for dysuria, urgency and difficulty urinating.  Musculoskeletal: Negative for arthralgias and gait problem.  Skin: Negative for color change and rash.  Hematological: Negative for adenopathy.  Psychiatric/Behavioral: Positive for sleep disturbance. Negative for dysphoric mood. The patient is not nervous/anxious.        Objective:    BP 144/80  Pulse 69  Temp(Src) 98.3 F (36.8 C) (Oral)  Wt 200 lb 8 oz (90.946 kg)  SpO2 96% Physical Exam  Constitutional: He is oriented to person, place, and time. He appears well-developed and well-nourished. No distress.  HENT:  Head: Normocephalic and atraumatic.  Right Ear: External ear normal.  Left Ear: External ear normal.  Nose: Nose normal.  Mouth/Throat: Oropharynx is clear and moist. No oropharyngeal exudate.  Eyes: Conjunctivae and EOM are normal. Pupils are equal, round, and reactive to light. Right eye exhibits no discharge. Left eye exhibits no discharge. No scleral icterus.  Neck:  Normal range of motion. Neck supple. No tracheal deviation present. No thyromegaly present.  Cardiovascular: Normal rate, regular rhythm and normal heart sounds.  Exam reveals no gallop and no friction rub.   No murmur heard. Pulmonary/Chest: Effort normal and breath sounds normal. No accessory muscle usage. Not tachypneic. No respiratory distress. He has no decreased breath sounds. He has no wheezes. He has no rhonchi. He has no rales. He exhibits no tenderness.  Musculoskeletal: Normal range of motion. He exhibits no edema.  Lymphadenopathy:    He has no cervical adenopathy.  Neurological: He is alert and oriented to person, place, and time. No cranial nerve deficit. Coordination normal.  Skin: Skin is warm and dry. No rash noted. He is not diaphoretic. No erythema. No pallor.  Psychiatric: He has a normal mood and affect. His behavior is normal. Judgment and thought content normal.          Assessment & Plan:   Problem List Items Addressed This Visit   Arthralgia     Symptoms improved with use of Meloxicam. Will continue. Discussed potential side effects including gastritis and ulceration. Encouraged him to take the medication with food.    Elevated LFTs     Elevated AST noted on recent labs. Will repeat LFTs with labs today.    Relevant Orders      Comp Met (CMET)   Insomnia - Primary     Symptoms poorly controlled with Sonata. Will change to Ambien 12m at bedtime. Discussed potential risks of this medication and appropriate use. Follow up 4 weeks and prn.    Relevant Medications      zolpidem (AMBIEN)  tablet       Return in about 4 weeks (around 06/05/2014) for Follow up insomnia.

## 2014-05-22 ENCOUNTER — Ambulatory Visit: Payer: Self-pay | Admitting: Ophthalmology

## 2014-06-07 ENCOUNTER — Ambulatory Visit: Payer: Commercial Managed Care - HMO | Admitting: Internal Medicine

## 2014-06-15 ENCOUNTER — Ambulatory Visit (INDEPENDENT_AMBULATORY_CARE_PROVIDER_SITE_OTHER): Payer: Commercial Managed Care - HMO | Admitting: Internal Medicine

## 2014-06-15 ENCOUNTER — Encounter: Payer: Self-pay | Admitting: Internal Medicine

## 2014-06-15 VITALS — BP 166/82 | HR 68 | Temp 98.3°F | Ht 67.25 in | Wt 197.5 lb

## 2014-06-15 DIAGNOSIS — R945 Abnormal results of liver function studies: Secondary | ICD-10-CM

## 2014-06-15 DIAGNOSIS — G4700 Insomnia, unspecified: Secondary | ICD-10-CM

## 2014-06-15 DIAGNOSIS — D1800 Hemangioma unspecified site: Secondary | ICD-10-CM | POA: Insufficient documentation

## 2014-06-15 DIAGNOSIS — R7989 Other specified abnormal findings of blood chemistry: Secondary | ICD-10-CM

## 2014-06-15 MED ORDER — ESZOPICLONE 2 MG PO TABS: 2.0000 mg | ORAL_TABLET | Freq: Every evening | ORAL | Status: DC | PRN

## 2014-06-15 NOTE — Progress Notes (Signed)
Subjective:    Patient ID: Jeffrey Kemp, male    DOB: [birth year], 80 y.o.   MRN: [id number]  HPI 80YO male presents for follow up.  Insomnia - Continues to have trouble staying asleep. Sleeps about 3 hours at night. No improvement with Lunesta. Naps during daytime  Foot pain - Persistent swelling. Ulceration on left medial ankle is larger. This has not been biopsied in the past. Area is not painful. Taking Meloxicam with some improvement in generalized ankle pain.  Review of Systems  Constitutional: Negative for fever, chills, activity change, appetite change, fatigue and unexpected weight change.  Eyes: Negative for visual disturbance.  Respiratory: Negative for cough and shortness of breath.   Cardiovascular: Positive for leg swelling ( left ankle). Negative for chest pain and palpitations.  Gastrointestinal: Negative for abdominal pain and abdominal distention.  Genitourinary: Negative for dysuria, urgency and difficulty urinating.  Musculoskeletal: Positive for arthralgias and myalgias. Negative for gait problem.  Skin: Positive for color change and wound. Negative for rash.  Hematological: Negative for adenopathy.  Psychiatric/Behavioral: Positive for sleep disturbance. Negative for dysphoric mood. The patient is not nervous/anxious.        Objective:    BP 166/82  Pulse 68  Temp(Src) 98.3 F (36.8 C) (Oral)  Ht 5' 7.25" (1.708 m)  Wt 197 lb 8 oz (89.585 kg)  BMI 30.71 kg/m2  SpO2 97% Physical Exam  Constitutional: He is oriented to person, place, and time. He appears well-developed and well-nourished. No distress.  HENT:  Head: Normocephalic and atraumatic.  Right Ear: External ear normal.  Left Ear: External ear normal.  Nose: Nose normal.  Mouth/Throat: Oropharynx is clear and moist. No oropharyngeal exudate.  Eyes: Conjunctivae and EOM are normal. Pupils are equal, round, and reactive to light. Right eye exhibits no discharge. Left eye exhibits no discharge. No  scleral icterus.  Neck: Normal range of motion. Neck supple. No tracheal deviation present. No thyromegaly present.  Cardiovascular: Normal rate, regular rhythm and normal heart sounds.  Exam reveals no gallop and no friction rub.   No murmur heard. Pulmonary/Chest: Effort normal and breath sounds normal. No accessory muscle usage. Not tachypneic. No respiratory distress. He has no decreased breath sounds. He has no wheezes. He has no rhonchi. He has no rales. He exhibits no tenderness.  Musculoskeletal: Normal range of motion. He exhibits edema (trace left ankle).  Lymphadenopathy:    He has no cervical adenopathy.  Neurological: He is alert and oriented to person, place, and time. No cranial nerve deficit. Coordination normal.  Skin: Skin is warm and dry. Lesion noted. No rash noted. He is not diaphoretic. No erythema. No pallor.     Psychiatric: He has a normal mood and affect. His behavior is normal. Judgment and thought content normal.          Assessment & Plan:   Problem List Items Addressed This Visit     Unprioritized   Elevated LFTs     Mild elevation of AST noted on recent labs. Will plan repeat LFTs in 06/2014. If no improvement, will get US liver.    Hemangioma     Lesion medial left ankle appears most consistent with hemangioma, however given recent increase in size and change in appearance, will set up evaluation for possible biopsy with Dr. Jamal Collin.    Relevant Orders      Ambulatory referral to General Surgery   Insomnia - Primary     Persistent insomnia. Will try change from  Ambien to Lunesta. Follow up 4 weeks and prn.    Relevant Medications      eszopiclone (LUNESTA) tablet       Return in about 4 weeks (around 07/13/2014).

## 2014-06-15 NOTE — Assessment & Plan Note (Signed)
Lesion medial left ankle appears most consistent with hemangioma, however given recent increase in size and change in appearance, will set up evaluation for possible biopsy with Dr. Jamal Collin.

## 2014-06-15 NOTE — Patient Instructions (Addendum)
Stop Ambien.  Start Lunesta 2mg  daily at bedtime to help with sleep.  We will set up an evaluation of your left foot with Dr. Jamal Collin.  Follow up in 4 weeks. Plan to repeat liver function tests at next visit.

## 2014-06-15 NOTE — Assessment & Plan Note (Signed)
Mild elevation of AST noted on recent labs. Will plan repeat LFTs in 06/2014. If no improvement, will get US liver.

## 2014-06-15 NOTE — Assessment & Plan Note (Signed)
Persistent insomnia. Will try change from Ambien to [facility]. Follow up 4 weeks and prn.

## 2014-06-15 NOTE — Progress Notes (Signed)
Pre visit review using our clinic review tool, if applicable. No additional management support is needed unless otherwise documented below in the visit note. 

## 2014-06-18 ENCOUNTER — Telehealth: Payer: Self-pay | Admitting: *Deleted

## 2014-06-18 NOTE — Telephone Encounter (Signed)
Fax received from [facility], Lunesta needing PA. Initiated PA online, given to Dr. Gilford Rile for signature.

## 2014-06-21 NOTE — Telephone Encounter (Signed)
Lunesta approved, authorization good until 12/20/14

## 2014-07-09 ENCOUNTER — Ambulatory Visit (INDEPENDENT_AMBULATORY_CARE_PROVIDER_SITE_OTHER): Payer: Medicare HMO | Admitting: General Surgery

## 2014-07-09 ENCOUNTER — Encounter: Payer: Self-pay | Admitting: General Surgery

## 2014-07-09 VITALS — BP 148/80 | HR 72 | Resp 14 | Ht 69.0 in | Wt 197.0 lb

## 2014-07-09 DIAGNOSIS — M799 Soft tissue disorder, unspecified: Secondary | ICD-10-CM

## 2014-07-09 DIAGNOSIS — M7989 Other specified soft tissue disorders: Secondary | ICD-10-CM

## 2014-07-09 DIAGNOSIS — D485 Neoplasm of uncertain behavior of skin: Secondary | ICD-10-CM

## 2014-07-09 NOTE — Progress Notes (Signed)
Jeffrey Kemp is a 80 y.o. male here today for ankle lesion . Patient states this area has been there for 2 years. He states the area is getting bigger and harder. He states the area opens up and bleeds.   Pt has venous insufficiency and has been evaluated for this here. 1.5cm by 1 cm raised nodular leasion left ankle-variegated color. No lymphadenopathy in inguinal area. Recommended excision. Procedure explained and he is agreeable. Patient to return for excision left ankle lesion.

## 2014-07-09 NOTE — Patient Instructions (Signed)
Patient to return for a excision left ankle lesion.

## 2014-07-09 NOTE — Progress Notes (Deleted)
Patient ID: Jeffrey Kemp, male   DOB: Feb 28, 1934, 80 y.o.   MRN: [id number]  Chief Complaint  Patient presents with  . Follow-up    left ankle lesion    HPI Jeffrey Kemp is a 80 y.o. male here today for ankle lesion . Patient states this area has been there for 2 years. He states the area is getting bigger and harder. He states the area opens up and bleeds.   HPI  Past Medical History  Diagnosis Date  . Cancer 2009    Prostate, s/p XRT  . Varicose veins   . Arthritis     Past Surgical History  Procedure Laterality Date  . Vein surgery Left 2010  . Appendectomy  12/17/2008    [name]  . Vein surgery Right 1991?     in [place]  . Cataract extraction Left 04/25/2014    Dr. Linton Flemings    Family History  Problem Relation Age of Onset  . Heart disease Mother   . Heart disease Brother   . Cancer Brother     Colon  . Bipolar disorder Daughter   . Heart disease Maternal Uncle     Social History History  Substance Use Topics  . Smoking status: Never Smoker   . Smokeless tobacco: Never Used  . Alcohol Use: No    No Known Allergies  Current Outpatient Prescriptions  Medication Sig Dispense Refill  . eszopiclone (LUNESTA) 2 MG TABS tablet Take 1 tablet (2 mg total) by mouth at bedtime as needed for sleep. Take immediately before bedtime  30 tablet  3  . meloxicam (MOBIC) 15 MG tablet Take 1 tablet (15 mg total) by mouth daily.  30 tablet  3   No current facility-administered medications for this visit.    Review of Systems Review of Systems  Height 5\' 9"  (1.753 m).  Physical Exam Physical Exam  Data Reviewed None  Assessment        Plan    Patient to return for a excision        Jeffrey Kemp 07/09/2014, 11:11 AM

## 2014-07-10 ENCOUNTER — Encounter: Payer: Self-pay | Admitting: General Surgery

## 2014-07-13 ENCOUNTER — Ambulatory Visit (INDEPENDENT_AMBULATORY_CARE_PROVIDER_SITE_OTHER): Payer: Medicare HMO | Admitting: General Surgery

## 2014-07-13 ENCOUNTER — Encounter: Payer: Self-pay | Admitting: General Surgery

## 2014-07-13 ENCOUNTER — Ambulatory Visit: Payer: Commercial Managed Care - HMO | Admitting: Internal Medicine

## 2014-07-13 VITALS — BP 132/72 | HR 76 | Resp 12 | Ht 69.0 in | Wt 197.0 lb

## 2014-07-13 DIAGNOSIS — L989 Disorder of the skin and subcutaneous tissue, unspecified: Secondary | ICD-10-CM

## 2014-07-13 NOTE — Progress Notes (Signed)
Patient ID: Jeffrey Kemp, male   DOB: 1934/04/28, 80 y.o.   MRN: [id number]  Chief Complaint  Patient presents with  . Procedure    left ankle lesion    HPI Jeffrey Kemp is a 80 y.o. male here today for a left ankle skin lesion excision .  HPI  Past Medical History  Diagnosis Date  . Cancer 2009    Prostate, s/p XRT  . Varicose veins   . Arthritis     Past Surgical History  Procedure Laterality Date  . Vein surgery Left 2010  . Appendectomy  12/17/2008    [name]  . Vein surgery Right 1991?     in [place]  . Cataract extraction Left 04/25/2014    Dr. Linton Flemings    Family History  Problem Relation Age of Onset  . Heart disease Mother   . Heart disease Brother   . Cancer Brother     Colon  . Bipolar disorder Daughter   . Heart disease Maternal Uncle     Social History History  Substance Use Topics  . Smoking status: Never Smoker   . Smokeless tobacco: Never Used  . Alcohol Use: No    No Known Allergies  Current Outpatient Prescriptions  Medication Sig Dispense Refill  . eszopiclone (LUNESTA) 2 MG TABS tablet Take 1 tablet (2 mg total) by mouth at bedtime as needed for sleep. Take immediately before bedtime  30 tablet  3  . meloxicam (MOBIC) 15 MG tablet Take 1 tablet (15 mg total) by mouth daily.  30 tablet  3   No current facility-administered medications for this visit.    Review of Systems Review of Systems  Blood pressure 132/72, pulse 76, resp. rate 12, height 5\' 9"  (1.753 m), weight 197 lb (89.359 kg).  Physical Exam Physical Exam 1.2 by 1cm raised pikish blue lesion inner aspect left ankle Data Reviewed    Assessment    Suspicious skin lesion     Plan    Excision performed today.  Procedure: excision skin lesion medial left ankle.  Anesthetic: 41ml of 1% xylocaine, mixed with 0.5% marcaine.  The left ankle skin lesion was prepped with ChloraPrep and draped out. The lesion measured 1.2 x 1 cm it was transversely oriented. Incision was made  with a 1-2 mm margin surrounding this from the anterior and posterior aspect and the lesion excised out completely. The anterior end was tagged with a nylon stitch. Bleeding controlled with disposable cautery. The skin reapproximated with interrupted stitches of 3-0 nylon. Neosporin ointment 4 x 4 gauze and Kling applied.  RX given for Tylenol 50 mg #20. One every 6 hour when necessary        [name],SEEPLAPUTHUR G 07/13/2014, 10:09 AM

## 2014-07-13 NOTE — Patient Instructions (Signed)
Leave dressing intact for 2-3 days. Return in 3 days for wound check.

## 2014-07-16 ENCOUNTER — Ambulatory Visit (INDEPENDENT_AMBULATORY_CARE_PROVIDER_SITE_OTHER): Payer: Self-pay | Admitting: *Deleted

## 2014-07-16 DIAGNOSIS — L989 Disorder of the skin and subcutaneous tissue, unspecified: Secondary | ICD-10-CM

## 2014-07-16 LAB — PATHOLOGY

## 2014-07-16 NOTE — Progress Notes (Signed)
Patient came in today for a wound check.  The wound is clean, with no signs of infection noted. .Follow up as scheduled.  

## 2014-07-26 ENCOUNTER — Ambulatory Visit (INDEPENDENT_AMBULATORY_CARE_PROVIDER_SITE_OTHER): Payer: Self-pay | Admitting: General Surgery

## 2014-07-26 ENCOUNTER — Encounter: Payer: Self-pay | Admitting: General Surgery

## 2014-07-26 VITALS — BP 138/78 | HR 72 | Resp 14 | Ht 69.0 in | Wt 198.0 lb

## 2014-07-26 DIAGNOSIS — C44711 Basal cell carcinoma of skin of unspecified lower limb, including hip: Secondary | ICD-10-CM

## 2014-07-26 DIAGNOSIS — C44719 Basal cell carcinoma of skin of left lower limb, including hip: Secondary | ICD-10-CM

## 2014-07-26 NOTE — Patient Instructions (Signed)
Patient to return as needed. 

## 2014-07-26 NOTE — Progress Notes (Signed)
This is a 80 year old male here today following up from his left ankle skin lesion excision done on 07/13/14. Patient states he is doing well.  The sutures were removed and steri strips applied. Area looks clean and healing well.  Path showed [place] Patient advised on this.  Follow up as needed.

## 2014-08-07 ENCOUNTER — Other Ambulatory Visit: Payer: Self-pay | Admitting: Internal Medicine

## 2014-08-07 NOTE — Telephone Encounter (Signed)
Ok refill? 

## 2014-08-08 ENCOUNTER — Encounter: Payer: Self-pay | Admitting: Internal Medicine

## 2014-08-08 ENCOUNTER — Ambulatory Visit (INDEPENDENT_AMBULATORY_CARE_PROVIDER_SITE_OTHER): Payer: Commercial Managed Care - HMO | Admitting: Internal Medicine

## 2014-08-08 VITALS — BP 158/68 | HR 66 | Temp 98.2°F | Ht 67.25 in | Wt 197.5 lb

## 2014-08-08 DIAGNOSIS — I10 Essential (primary) hypertension: Secondary | ICD-10-CM | POA: Insufficient documentation

## 2014-08-08 DIAGNOSIS — R030 Elevated blood-pressure reading, without diagnosis of hypertension: Secondary | ICD-10-CM

## 2014-08-08 DIAGNOSIS — C44711 Basal cell carcinoma of skin of unspecified lower limb, including hip: Secondary | ICD-10-CM

## 2014-08-08 DIAGNOSIS — C44719 Basal cell carcinoma of skin of left lower limb, including hip: Secondary | ICD-10-CM

## 2014-08-08 DIAGNOSIS — G4700 Insomnia, unspecified: Secondary | ICD-10-CM

## 2014-08-08 DIAGNOSIS — IMO0001 Reserved for inherently not codable concepts without codable children: Secondary | ICD-10-CM

## 2014-08-08 MED ORDER — ALPRAZOLAM 0.25 MG PO TABS: 0.2500 mg | ORAL_TABLET | Freq: Two times a day (BID) | ORAL | Status: DC | PRN

## 2014-08-08 NOTE — Progress Notes (Signed)
Subjective:    Patient ID: Jeffrey Kemp, male    DOB: 1934/07/03, 80 y.o.   MRN: [id number]  HPI 80YO male presents for follow up.  Insomnia - No improvement in sleep with Lunesta. Stopped medication. Would like to try Alprazolam as this works for his wife.  Notes increased stress lately with planned move to [place]. Trying to sell home to move closer to his son.  BCC - left ankle skin biopsy showed BCC.   Review of Systems  Constitutional: Negative for fever, chills, activity change, appetite change, fatigue and unexpected weight change.  Eyes: Negative for visual disturbance.  Respiratory: Negative for cough and shortness of breath.   Cardiovascular: Negative for chest pain, palpitations and leg swelling.  Gastrointestinal: Negative for abdominal pain and abdominal distention.  Genitourinary: Negative for dysuria, urgency and difficulty urinating.  Musculoskeletal: Negative for arthralgias and gait problem.  Skin: Negative for color change and rash.  Hematological: Negative for adenopathy.  Psychiatric/Behavioral: Positive for sleep disturbance. Negative for dysphoric mood. The patient is not nervous/anxious.        Objective:    BP 158/68  Pulse 66  Temp(Src) 98.2 F (36.8 C) (Oral)  Ht 5' 7.25" (1.708 m)  Wt 197 lb 8 oz (89.585 kg)  BMI 30.71 kg/m2  SpO2 96% Physical Exam  Constitutional: He is oriented to person, place, and time. He appears well-developed and well-nourished. No distress.  HENT:  Head: Normocephalic and atraumatic.  Right Ear: External ear normal.  Left Ear: External ear normal.  Nose: Nose normal.  Mouth/Throat: Oropharynx is clear and moist. No oropharyngeal exudate.  Eyes: Conjunctivae and EOM are normal. Pupils are equal, round, and reactive to light. Right eye exhibits no discharge. Left eye exhibits no discharge. No scleral icterus.  Neck: Normal range of motion. Neck supple. No tracheal deviation present. No thyromegaly present.    Cardiovascular: Normal rate, regular rhythm and normal heart sounds.  Exam reveals no gallop and no friction rub.   No murmur heard. Pulmonary/Chest: Effort normal and breath sounds normal. No accessory muscle usage. Not tachypneic. No respiratory distress. He has no decreased breath sounds. He has no wheezes. He has no rhonchi. He has no rales. He exhibits no tenderness.  Musculoskeletal: Normal range of motion. He exhibits no edema.  Lymphadenopathy:    He has no cervical adenopathy.  Neurological: He is alert and oriented to person, place, and time. No cranial nerve deficit. Coordination normal.  Skin: Skin is warm and dry. No rash noted. He is not diaphoretic. No erythema. No pallor.  Psychiatric: His behavior is normal. Judgment and thought content normal. His mood appears anxious.          Assessment & Plan:   Problem List Items Addressed This Visit     Unprioritized   Basal cell carcinoma of skin of ankle     S/p resection by Dr. Jamal Collin. Deep margin was positive. Will monitor closely. May ultimately need additional resection.    Relevant Medications      ALPRAZolam  (XANAX) tablet   Elevated BP      BP Readings from Last 3 Encounters:  08/08/14 158/68  07/26/14 138/78  07/13/14 132/72   BP elevated today, however historically has been well controlled. Will monitor for now and recheck in 4 weeks.    Insomnia - Primary     Persistent difficulty falling asleep and staying asleep. Will try prn low dose of Alprazolam. Discussed potential side effects of this medication. Follow  up 4 weeks.    Relevant Medications      ALPRAZolam  (XANAX) tablet       Return in about 4 weeks (around 09/05/2014) for Recheck.

## 2014-08-08 NOTE — Assessment & Plan Note (Signed)
Persistent difficulty falling asleep and staying asleep. Will try prn low dose of Alprazolam. Discussed potential side effects of this medication. Follow up 4 weeks.

## 2014-08-08 NOTE — Assessment & Plan Note (Signed)
BP Readings from Last 3 Encounters:  08/08/14 158/68  07/26/14 138/78  07/13/14 132/72   BP elevated today, however historically has been well controlled. Will monitor for now and recheck in 4 weeks.

## 2014-08-08 NOTE — Patient Instructions (Signed)
Start Alprazolam 0.25mg  at bedtime to help with sleep.  Follow up in 4 weeks.

## 2014-08-08 NOTE — Assessment & Plan Note (Signed)
S/p resection by Dr. Jamal Collin. Deep margin was positive. Will monitor closely. May ultimately need additional resection.

## 2014-08-08 NOTE — Progress Notes (Signed)
Pre visit review using our clinic review tool, if applicable. No additional management support is needed unless otherwise documented below in the visit note. 

## 2014-09-13 ENCOUNTER — Ambulatory Visit (INDEPENDENT_AMBULATORY_CARE_PROVIDER_SITE_OTHER): Payer: Commercial Managed Care - HMO | Admitting: Internal Medicine

## 2014-09-13 ENCOUNTER — Encounter: Payer: Self-pay | Admitting: Internal Medicine

## 2014-09-13 VITALS — BP 158/72 | HR 66 | Temp 98.2°F | Ht 67.25 in | Wt 199.0 lb

## 2014-09-13 DIAGNOSIS — I10 Essential (primary) hypertension: Secondary | ICD-10-CM

## 2014-09-13 DIAGNOSIS — Z23 Encounter for immunization: Secondary | ICD-10-CM

## 2014-09-13 DIAGNOSIS — G4700 Insomnia, unspecified: Secondary | ICD-10-CM

## 2014-09-13 MED ORDER — CLONAZEPAM 0.5 MG PO TABS: 0.5000 mg | ORAL_TABLET | Freq: Every day | ORAL | Status: DC

## 2014-09-13 MED ORDER — AMLODIPINE BESYLATE 2.5 MG PO TABS: 2.5000 mg | ORAL_TABLET | Freq: Every day | ORAL | Status: DC

## 2014-09-13 NOTE — Assessment & Plan Note (Signed)
No improvement with several different medications. Some medications have also been cost-prohibitive. Will try Clonazepam 0.5mg  daily at bedtime as needed. Follow up in 4 weeks.

## 2014-09-13 NOTE — Progress Notes (Signed)
Pre visit review using our clinic review tool, if applicable. No additional management support is needed unless otherwise documented below in the visit note. 

## 2014-09-13 NOTE — Patient Instructions (Signed)
Start Amlodipine 2.5mg  daily to help control blood pressure.  Start Clonazepam 0.5mg  at bedtime to help with sleep.  Follow up in 4 weeks or sooner as needed.

## 2014-09-13 NOTE — Assessment & Plan Note (Signed)
BP Readings from Last 3 Encounters:  09/13/14 158/72  08/08/14 158/68  07/26/14 138/78   BP continues to be elevated. Will start Amlodipine 2.5mg  daily. Recheck BP in 4 weeks.

## 2014-09-13 NOTE — Progress Notes (Signed)
   Subjective:    Patient ID: Jeffrey Kemp, male    DOB: [birth year], 80 y.o.   MRN: [id number]  HPI 80YO male presents for follow up.  Insomnia - Continues to have trouble sleeping. No improvement with Alprazolam. Goes to bed around 11:30pm then wakes around 3am. Sleep described as restless.  HTN - No recent chest pain, headaches. Has never taken medication to lower BP.  Review of Systems  Constitutional: Negative for fever, chills, activity change, appetite change, fatigue and unexpected weight change.  Eyes: Negative for visual disturbance.  Respiratory: Negative for cough and shortness of breath.   Cardiovascular: Negative for chest pain, palpitations and leg swelling.  Gastrointestinal: Negative for abdominal pain and abdominal distention.  Genitourinary: Negative for dysuria, urgency and difficulty urinating.  Musculoskeletal: Negative for arthralgias and gait problem.  Skin: Negative for color change and rash.  Neurological: Negative for headaches.  Hematological: Negative for adenopathy.  Psychiatric/Behavioral: Positive for sleep disturbance. Negative for dysphoric mood. The patient is not nervous/anxious.        Objective:    BP 158/72  Pulse 66  Temp(Src) 98.2 F (36.8 C) (Oral)  Ht 5' 7.25" (1.708 m)  Wt 199 lb (90.266 kg)  BMI 30.94 kg/m2  SpO2 97% Physical Exam  Constitutional: He is oriented to person, place, and time. He appears well-developed and well-nourished. No distress.  HENT:  Head: Normocephalic and atraumatic.  Right Ear: External ear normal.  Left Ear: External ear normal.  Nose: Nose normal.  Mouth/Throat: Oropharynx is clear and moist. No oropharyngeal exudate.  Eyes: Conjunctivae and EOM are normal. Pupils are equal, round, and reactive to light. Right eye exhibits no discharge. Left eye exhibits no discharge. No scleral icterus.  Neck: Normal range of motion. Neck supple. No tracheal deviation present. No thyromegaly present.  Cardiovascular:  Normal rate, regular rhythm and normal heart sounds.  Exam reveals no gallop and no friction rub.   No murmur heard. Pulmonary/Chest: Effort normal and breath sounds normal. No accessory muscle usage. Not tachypneic. No respiratory distress. He has no decreased breath sounds. He has no wheezes. He has no rhonchi. He has no rales. He exhibits no tenderness.  Musculoskeletal: Normal range of motion. He exhibits no edema.  Lymphadenopathy:    He has no cervical adenopathy.  Neurological: He is alert and oriented to person, place, and time. No cranial nerve deficit. Coordination normal.  Skin: Skin is warm and dry. No rash noted. He is not diaphoretic. No erythema. No pallor.  Psychiatric: He has a normal mood and affect. His behavior is normal. Judgment and thought content normal.          Assessment & Plan:   Problem List Items Addressed This Visit     Unprioritized   Hypertension      BP Readings from Last 3 Encounters:  09/13/14 158/72  08/08/14 158/68  07/26/14 138/78   BP continues to be elevated. Will start Amlodipine 2.5mg  daily. Recheck BP in 4 weeks.    Relevant Medications      amLODIpine (NORVASC)  tablet   Insomnia - Primary     No improvement with several different medications. Some medications have also been cost-prohibitive. Will try Clonazepam 0.5mg  daily at bedtime as needed. Follow up in 4 weeks.    Relevant Medications      clonazePAM (KLONOPIN)  tablet       Return in about 4 weeks (around 10/11/2014) for Recheck.

## 2014-10-22 ENCOUNTER — Encounter: Payer: Self-pay | Admitting: Internal Medicine

## 2014-10-22 ENCOUNTER — Ambulatory Visit (INDEPENDENT_AMBULATORY_CARE_PROVIDER_SITE_OTHER): Payer: Commercial Managed Care - HMO | Admitting: Internal Medicine

## 2014-10-22 VITALS — BP 150/60 | HR 68 | Temp 98.2°F | Ht 67.25 in | Wt 201.8 lb

## 2014-10-22 DIAGNOSIS — H1013 Acute atopic conjunctivitis, bilateral: Secondary | ICD-10-CM

## 2014-10-22 DIAGNOSIS — G4700 Insomnia, unspecified: Secondary | ICD-10-CM

## 2014-10-22 DIAGNOSIS — H1010 Acute atopic conjunctivitis, unspecified eye: Secondary | ICD-10-CM | POA: Insufficient documentation

## 2014-10-22 NOTE — Patient Instructions (Addendum)
Start Claritin to help with itchy eyes.  Continue Clonazepam as needed.  Follow up in 3 months.

## 2014-10-22 NOTE — Assessment & Plan Note (Signed)
Will start prn Claritin. If no improvement, discussed using antihistamine eye drops. Pt will call or email with update.

## 2014-10-22 NOTE — Progress Notes (Signed)
   Subjective:    Patient ID: Jeffrey Kemp, male    DOB: 1934/01/01, 80 y.o.   MRN: [id number]  HPI 80YO male presents for follow up.  Itchy eyes - noted during fall months. Occasional sneezing, runny nose. No fever, chills, eye pain.  Insomnia - Symptoms improved with Clonazepam with >4hr sleep per night. Using only 3-4 days per week. No side effects noted except drowsiness one morning.   Review of Systems  Constitutional: Negative for fever, chills, activity change, appetite change, fatigue and unexpected weight change.  Eyes: Positive for itching. Negative for photophobia, pain, discharge, redness and visual disturbance.  Respiratory: Negative for cough and shortness of breath.   Cardiovascular: Negative for chest pain, palpitations and leg swelling.  Gastrointestinal: Negative for abdominal pain and abdominal distention.  Genitourinary: Negative for dysuria, urgency and difficulty urinating.  Musculoskeletal: Negative for arthralgias and gait problem.  Skin: Negative for color change and rash.  Hematological: Negative for adenopathy.  Psychiatric/Behavioral: Positive for sleep disturbance. Negative for dysphoric mood. The patient is not nervous/anxious.        Objective:    BP 150/60 mmHg  Pulse 68  Temp(Src) 98.2 F (36.8 C) (Oral)  Ht 5' 7.25" (1.708 m)  Wt 201 lb 12 oz (91.513 kg)  BMI 31.37 kg/m2  SpO2 96% Physical Exam  Constitutional: He is oriented to person, place, and time. He appears well-developed and well-nourished. No distress.  HENT:  Head: Normocephalic and atraumatic.  Right Ear: External ear normal.  Left Ear: External ear normal.  Nose: Nose normal.  Mouth/Throat: Oropharynx is clear and moist. No oropharyngeal exudate.  Eyes: Conjunctivae and EOM are normal. Pupils are equal, round, and reactive to light. Right eye exhibits no discharge. Left eye exhibits no discharge. No scleral icterus.  Neck: Normal range of motion. Neck supple. No tracheal  deviation present. No thyromegaly present.  Cardiovascular: Normal rate, regular rhythm and normal heart sounds.  Exam reveals no gallop and no friction rub.   No murmur heard. Pulmonary/Chest: Effort normal and breath sounds normal. No accessory muscle usage. No tachypnea. No respiratory distress. He has no decreased breath sounds. He has no wheezes. He has no rhonchi. He has no rales. He exhibits no tenderness.  Musculoskeletal: Normal range of motion. He exhibits no edema.  Lymphadenopathy:    He has no cervical adenopathy.  Neurological: He is alert and oriented to person, place, and time. No cranial nerve deficit. Coordination normal.  Skin: Skin is warm and dry. No rash noted. He is not diaphoretic. No erythema. No pallor.  Psychiatric: He has a normal mood and affect. His behavior is normal. Judgment and thought content normal.          Assessment & Plan:   Problem List Items Addressed This Visit      Unprioritized   Allergic conjunctivitis    Will start prn Claritin. If no improvement, discussed using antihistamine eye drops. Pt will call or email with update.    Insomnia - Primary    Symptoms improved with Clonazepam. Will continue prn.        Return in about 5 months (around 03/23/2015) for Wellness Visit.

## 2014-10-22 NOTE — Assessment & Plan Note (Signed)
Symptoms improved with Clonazepam. Will continue prn.

## 2014-10-22 NOTE — Progress Notes (Signed)
Pre visit review using our clinic review tool, if applicable. No additional management support is needed unless otherwise documented below in the visit note. 

## 2015-01-29 ENCOUNTER — Other Ambulatory Visit: Payer: Self-pay | Admitting: Internal Medicine

## 2015-01-29 NOTE — Telephone Encounter (Signed)
rx faxed

## 2015-01-29 NOTE — Telephone Encounter (Signed)
Last OV 11.2.15, last refill 1.8.16.  Please advise refill

## 2015-03-26 ENCOUNTER — Telehealth: Payer: Self-pay | Admitting: Internal Medicine

## 2015-03-26 ENCOUNTER — Encounter: Payer: Self-pay | Admitting: Internal Medicine

## 2015-03-26 ENCOUNTER — Ambulatory Visit (INDEPENDENT_AMBULATORY_CARE_PROVIDER_SITE_OTHER): Payer: Commercial Managed Care - HMO | Admitting: Internal Medicine

## 2015-03-26 VITALS — BP 170/77 | HR 66 | Temp 98.3°F | Ht 68.0 in | Wt 207.4 lb

## 2015-03-26 DIAGNOSIS — Z8546 Personal history of malignant neoplasm of prostate: Secondary | ICD-10-CM

## 2015-03-26 DIAGNOSIS — Z0000 Encounter for general adult medical examination without abnormal findings: Secondary | ICD-10-CM

## 2015-03-26 DIAGNOSIS — I10 Essential (primary) hypertension: Secondary | ICD-10-CM

## 2015-03-26 LAB — LIPID PANEL
CHOLESTEROL: 210 mg/dL — AB (ref 0–200)
HDL: 36.7 mg/dL — ABNORMAL LOW (ref >39.00)
LDL CALC: 136 mg/dL — AB (ref 0–99)
NonHDL: 173.3
Total CHOL/HDL Ratio: 6
Triglycerides: 189 mg/dL — ABNORMAL HIGH (ref 0.0–149.0)
VLDL: 37.8 mg/dL (ref 0.0–40.0)

## 2015-03-26 LAB — CBC WITH DIFFERENTIAL/PLATELET
BASOS ABS: 0 10*3/uL (ref 0.0–0.1)
Basophils Relative: 0.7 % (ref 0.0–3.0)
Eosinophils Absolute: 0.2 10*3/uL (ref 0.0–0.7)
Eosinophils Relative: 3 % (ref 0.0–5.0)
HCT: 40.5 % (ref 39.0–52.0)
Hemoglobin: 13.9 g/dL (ref 13.0–17.0)
LYMPHS PCT: 24.7 % (ref 12.0–46.0)
Lymphs Abs: 1.5 10*3/uL (ref 0.7–4.0)
MCHC: 34.4 g/dL (ref 30.0–36.0)
MCV: 88.1 fl (ref 78.0–100.0)
MONOS PCT: 10.8 % (ref 3.0–12.0)
Monocytes Absolute: 0.6 10*3/uL (ref 0.1–1.0)
Neutro Abs: 3.7 10*3/uL (ref 1.4–7.7)
Neutrophils Relative %: 60.8 % (ref 43.0–77.0)
PLATELETS: 229 10*3/uL (ref 150.0–400.0)
RBC: 4.6 Mil/uL (ref 4.22–5.81)
RDW: 13.8 % (ref 11.5–15.5)
WBC: 6 10*3/uL (ref 4.0–10.5)

## 2015-03-26 LAB — COMPREHENSIVE METABOLIC PANEL
ALBUMIN: 3.7 g/dL (ref 3.5–5.2)
ALT: 18 U/L (ref 0–53)
AST: 32 U/L (ref 0–37)
Alkaline Phosphatase: 87 U/L (ref 39–117)
BUN: 16 mg/dL (ref 6–23)
CALCIUM: 9.2 mg/dL (ref 8.4–10.5)
CHLORIDE: 104 meq/L (ref 96–112)
CO2: 25 mEq/L (ref 19–32)
Creatinine, Ser: 1 mg/dL (ref 0.40–1.50)
GFR: 76.6 mL/min (ref >60.00)
Glucose, Bld: 88 mg/dL (ref 70–99)
POTASSIUM: 5.3 meq/L — AB (ref 3.5–5.1)
SODIUM: 133 meq/L — AB (ref 135–145)
Total Bilirubin: 0.6 mg/dL (ref 0.2–1.2)
Total Protein: 7.3 g/dL (ref 6.0–8.3)

## 2015-03-26 LAB — MICROALBUMIN / CREATININE URINE RATIO
Creatinine,U: 173.9 mg/dL
MICROALB UR: 1.1 mg/dL (ref 0.0–1.9)
Microalb Creat Ratio: 0.6 mg/g (ref 0.0–30.0)

## 2015-03-26 LAB — PSA, MEDICARE: PSA: 0.01 ng/mL — AB (ref 0.10–4.00)

## 2015-03-26 NOTE — Progress Notes (Signed)
Subjective:    Patient ID: Jeffrey Kemp, male    DOB: 08/25/1934, 81 y.o.   MRN: [id number]  HPI The patient is here for annual Medicare Wellness Examination and management of other chronic and acute problems.   The risk factors are reflected in the history.  The roster of all physicians providing medical care to patient - is listed in the Snapshot section of the chart.  Activities of daily living:   The patient is 100% independent in all ADLs: dressing, toileting, feeding as well as independent mobility. Patient lives with wife. No pets. One story.  Home safety :  The patient has smoke detectors in the home.  They wear seatbelts in their car. There are no firearms at home.  There is no violence in the home. They feel safe where they live.  Infectious Risks: There is no risks for hepatitis, STDs or HIV.  There is no  history of blood transfusion.  They have no travel history to infectious disease endemic areas of the world.  Additional Health Care Providers: The patient has seen their dentist in the last six months.  [facility] They have seen their eye doctor in the last year. Vision improved after cataract removal. Opthalmologist - Dr. Linton Kemp They deny hearing issues. They have deferred audiologic testing in the last year.   They do not  have excessive sun exposure. Discussed the need for sun protection: hats,long sleeves and use of sunscreen if there is significant sun exposure.  Dermatologist - Dr. Nehemiah Kemp  Diet: the importance of a healthy diet is discussed. They do have a healthy diet.  The benefits of regular aerobic exercise were discussed. Patient is physically active, but no scheduled exercise.  Depression screen: there are no signs or vegative symptoms of depression- irritability, change in appetite, anhedonia, sadness/tearfullness.   Cognitive assessment: the patient manages all their financial and personal affairs and is actively engaged.   HCPOA -  wife, Jeffrey Kemp   The following portions of the patient's history were reviewed and updated as appropriate: allergies, current medications, past family history, past medical history,  past surgical history, past social history and problem list.  Visual acuity was not assessed per patient preference as they have regular follow up with their ophthalmologist. Hearing and body mass index were assessed and reviewed.   During the course of the visit the patient was educated and counseled about appropriate screening and preventive services including : fall prevention , diabetes screening, nutrition counseling, colorectal cancer screening, and recommended immunizations.     HTN - BP at home typically 109 systolic. BP Readings from Last 3 Encounters:  03/26/15 170/77  10/22/14 150/60  09/13/14 158/72      Past medical, surgical, family and social history per today's encounter.  Review of Systems  Constitutional: Negative for fever, chills, activity change, appetite change, fatigue and unexpected weight change.  Eyes: Negative for visual disturbance.  Respiratory: Negative for cough and shortness of breath.   Cardiovascular: Negative for chest pain, palpitations and leg swelling.  Gastrointestinal: Negative for nausea, vomiting, abdominal pain, diarrhea, constipation and abdominal distention.  Genitourinary: Negative for dysuria, urgency and difficulty urinating.  Musculoskeletal: Positive for arthralgias. Negative for myalgias and gait problem.  Skin: Negative for color change and rash.  Hematological: Negative for adenopathy.  Psychiatric/Behavioral: Negative for suicidal ideas, sleep disturbance and dysphoric mood. The patient is not nervous/anxious.        Objective:    BP 170/77 mmHg  Pulse 66  Temp(Src) 98.3 F (36.8 C) (Oral)  Ht 5\' 8"  (1.727 m)  Wt 207 lb 6 oz (94.065 kg)  BMI 31.54 kg/m2  SpO2 95% Physical Exam  Constitutional: He is oriented to person, place, and  time. He appears well-developed and well-nourished. No distress.  HENT:  Head: Normocephalic and atraumatic.  Right Ear: External ear normal.  Left Ear: External ear normal.  Nose: Nose normal.  Mouth/Throat: Oropharynx is clear and moist. No oropharyngeal exudate.  Eyes: Conjunctivae and EOM are normal. Pupils are equal, round, and reactive to light. Right eye exhibits no discharge. Left eye exhibits no discharge. No scleral icterus.  Neck: Normal range of motion. Neck supple. No tracheal deviation present. No thyromegaly present.  Cardiovascular: Normal rate, regular rhythm and normal heart sounds.  Exam reveals no gallop and no friction rub.   No murmur heard. Pulmonary/Chest: Effort normal and breath sounds normal. No respiratory distress. He has no wheezes. He has no rales. He exhibits no tenderness.  Abdominal: Soft. Bowel sounds are normal. He exhibits no distension and no mass. There is no tenderness. There is no rebound and no guarding.  Musculoskeletal: Normal range of motion. He exhibits no edema.  Lymphadenopathy:    He has no cervical adenopathy.  Neurological: He is alert and oriented to person, place, and time. No cranial nerve deficit. Coordination normal.  Skin: Skin is warm and dry. No rash noted. He is not diaphoretic. No erythema. No pallor.  Psychiatric: He has a normal mood and affect. His behavior is normal. Judgment and thought content normal.          Assessment & Plan:   Problem List Items Addressed This Visit      Unprioritized   History of prostate cancer    Will check PSA with labs today.      Relevant Orders   PSA, Medicare   Hypertension    BP Readings from Last 3 Encounters:  03/26/15 170/77  10/22/14 150/60  09/13/14 158/72   BP elevated today but has been well controlled at home. Will continue to monitor. Recheck in 4 weeks. We discussed potentially increasing Amlodipine if systolic BP continues to be elevated >150.      Relevant Orders     Comprehensive metabolic panel   Microalbumin / creatinine urine ratio   Medicare annual wellness visit, subsequent - Primary    General medical exam normal today except as noted. Immunizations are UTD. Encouraged healthy diet and regular exercise. Will check CBC, CMP, lipids, PSA with labs.   Patient was given a handout regarding current recommendations for health maintenance and preventative care on the AVS.      Relevant Orders   CBC with Differential/Platelet   Lipid panel       Return in about 4 weeks (around 04/23/2015) for Recheck.

## 2015-03-26 NOTE — Telephone Encounter (Signed)
emmi mailed  °

## 2015-03-26 NOTE — Patient Instructions (Signed)

## 2015-03-26 NOTE — Assessment & Plan Note (Signed)
Will check PSA with labs today.

## 2015-03-26 NOTE — Assessment & Plan Note (Signed)
General medical exam normal today except as noted. Immunizations are UTD. Encouraged healthy diet and regular exercise. Will check CBC, CMP, lipids, PSA with labs.   Patient was given a handout regarding current recommendations for health maintenance and preventative care on the AVS.

## 2015-03-26 NOTE — Progress Notes (Signed)
Pre visit review using our clinic review tool, if applicable. No additional management support is needed unless otherwise documented below in the visit note. 

## 2015-03-26 NOTE — Assessment & Plan Note (Signed)
BP Readings from Last 3 Encounters:  03/26/15 170/77  10/22/14 150/60  09/13/14 158/72   BP elevated today but has been well controlled at home. Will continue to monitor. Recheck in 4 weeks. We discussed potentially increasing Amlodipine if systolic BP continues to be elevated >150.

## 2015-03-29 ENCOUNTER — Other Ambulatory Visit: Payer: Self-pay | Admitting: *Deleted

## 2015-03-29 ENCOUNTER — Other Ambulatory Visit (INDEPENDENT_AMBULATORY_CARE_PROVIDER_SITE_OTHER): Payer: Commercial Managed Care - HMO

## 2015-03-29 DIAGNOSIS — E875 Hyperkalemia: Secondary | ICD-10-CM

## 2015-03-29 LAB — BASIC METABOLIC PANEL
BUN: 20 mg/dL (ref 6–23)
CO2: 26 mEq/L (ref 19–32)
Calcium: 9.1 mg/dL (ref 8.4–10.5)
Chloride: 105 mEq/L (ref 96–112)
Creatinine, Ser: 1.05 mg/dL (ref 0.40–1.50)
GFR: 72.41 mL/min (ref >60.00)
GLUCOSE: 85 mg/dL (ref 70–99)
POTASSIUM: 4.4 meq/L (ref 3.5–5.1)
Sodium: 135 mEq/L (ref 135–145)

## 2015-04-13 NOTE — Op Note (Signed)
PATIENT NAME:  Jeffrey Kemp, Jeffrey Kemp MR#:  [id number] DATE OF BIRTH:  May 03, 1934  DATE OF PROCEDURE:  05/22/2014  PREOPERATIVE DIAGNOSIS: Visually significant cataract of the right eye.   POSTOPERATIVE DIAGNOSIS: Visually significant cataract of the right eye.   OPERATIVE PROCEDURE: Cataract extraction by phacoemulsification with implant of intraocular lens to right eye.   SURGEON: Birder Robson, MD.   ANESTHESIA:  1. Managed anesthesia care.  2. Topical tetracaine drops followed by 2% Xylocaine jelly applied in the preoperative holding area.   COMPLICATIONS: None.   TECHNIQUE:  Stop and chop.  DESCRIPTION OF PROCEDURE: The patient was examined and consented in the preoperative holding area where the aforementioned topical anesthesia was applied to the right eye and then brought back to the Operating Room where the right eye was prepped and draped in the usual sterile ophthalmic fashion and a lid speculum was placed. A paracentesis was created with the side port blade and the anterior chamber was filled with viscoelastic. A near clear corneal incision was performed with the steel keratome. A continuous curvilinear capsulorrhexis was performed with a cystotome followed by the capsulorrhexis forceps. Hydrodissection and hydrodelineation were carried out with BSS on a blunt cannula. The lens was removed in a stop and chop technique and the remaining cortical material was removed with the irrigation-aspiration handpiece. The capsular bag was inflated with viscoelastic and the Tecnis ZCB00 20.0-diopter lens, serial number [id number] was placed in the capsular bag without complication. The remaining viscoelastic was removed from the eye with the irrigation-aspiration handpiece. The wounds were hydrated. The anterior chamber was flushed with Miostat and the eye was inflated to physiologic pressure. 0.1 mL of cefuroxime concentration 10 mg/mL was placed in the anterior chamber. The wounds were found to be water  tight. The eye was dressed with Vigamox. The patient was given protective glasses to wear throughout the day and a shield with which to sleep tonight. The patient was also given drops with which to begin a drop regimen today and will follow-up with me in one day.    ____________________________ Livingston Diones. Grayland Daisey, MD wlp:dmm D: 05/22/2014 21:22:45 ET T: 05/22/2014 21:32:37 ET JOB#: [id number]  cc: Raihan Kimmel L. Clemmie Buelna, MD, <Dictator> Livingston Diones Anita Laguna MD ELECTRONICALLY SIGNED 05/23/2014 13:44

## 2015-04-13 NOTE — Op Note (Signed)
PATIENT NAME:  Jeffrey Kemp, Jeffrey Kemp MR#:  [id number] DATE OF BIRTH:  Aug 15, 1934  DATE OF PROCEDURE:  05/01/2014  PREOPERATIVE DIAGNOSIS: Visually significant cataract of the left eye.   POSTOPERATIVE DIAGNOSIS: Visually significant cataract of the left eye.   OPERATIVE PROCEDURE: Cataract extraction by phacoemulsification with implant of intraocular lens to left eye.   SURGEON: Birder Robson, MD.   ANESTHESIA:  1. Managed anesthesia care.  2. Topical tetracaine drops followed by 2% Xylocaine jelly applied in the preoperative holding area.   COMPLICATIONS: None.   TECHNIQUE:  Stop and chop.  DESCRIPTION OF PROCEDURE: The patient was examined and consented in the preoperative holding area where the aforementioned topical anesthesia was applied to the left eye and then brought back to the Operating Room where the left eye was prepped and draped in the usual sterile ophthalmic fashion and a lid speculum was placed. A paracentesis was created with the side port blade and the anterior chamber was filled with viscoelastic. A near clear corneal incision was performed with the steel keratome. A continuous curvilinear capsulorrhexis was performed with a cystotome followed by the capsulorrhexis forceps. Hydrodissection and hydrodelineation were carried out with BSS on a blunt cannula. The lens was removed in a stop and chop  technique and the remaining cortical material was removed with the irrigation-aspiration handpiece. The capsular bag was inflated with viscoelastic and the Tecnis ZCB00 20.0-diopter lens, serial number [id number] was placed in the capsular bag without complication. The remaining viscoelastic was removed from the eye with the irrigation-aspiration handpiece. The wounds were hydrated. The anterior chamber was flushed with Miostat and the eye was inflated to physiologic pressure. 0.1 mL of cefuroxime concentration 10 mg/mL was placed in the anterior chamber. The wounds were found to be water  tight. The eye was dressed with Vigamox. The patient was given protective glasses to wear throughout the day and a shield with which to sleep tonight. The patient was also given drops with which to begin a drop regimen today and will follow-up with me in one day.      ____________________________ Livingston Diones. Letrell Attwood, MD wlp:dmm D: 05/01/2014 22:00:05 ET T: 05/01/2014 22:16:43 ET JOB#: [id number]  cc: Magon Croson L. Adoria Kawamoto, MD, <Dictator> Livingston Diones Traxton Kolenda MD ELECTRONICALLY SIGNED 05/03/2014 13:29

## 2015-05-11 IMAGING — CR DG ANKLE COMPLETE 3+V*L*
2 series · 2 of 2 positions shown · non-contrast
Comparison: None.

CLINICAL DATA: Pain.

EXAM:
LEFT ANKLE COMPLETE - 3+ VIEW

[view not recorded (1 of 2)]
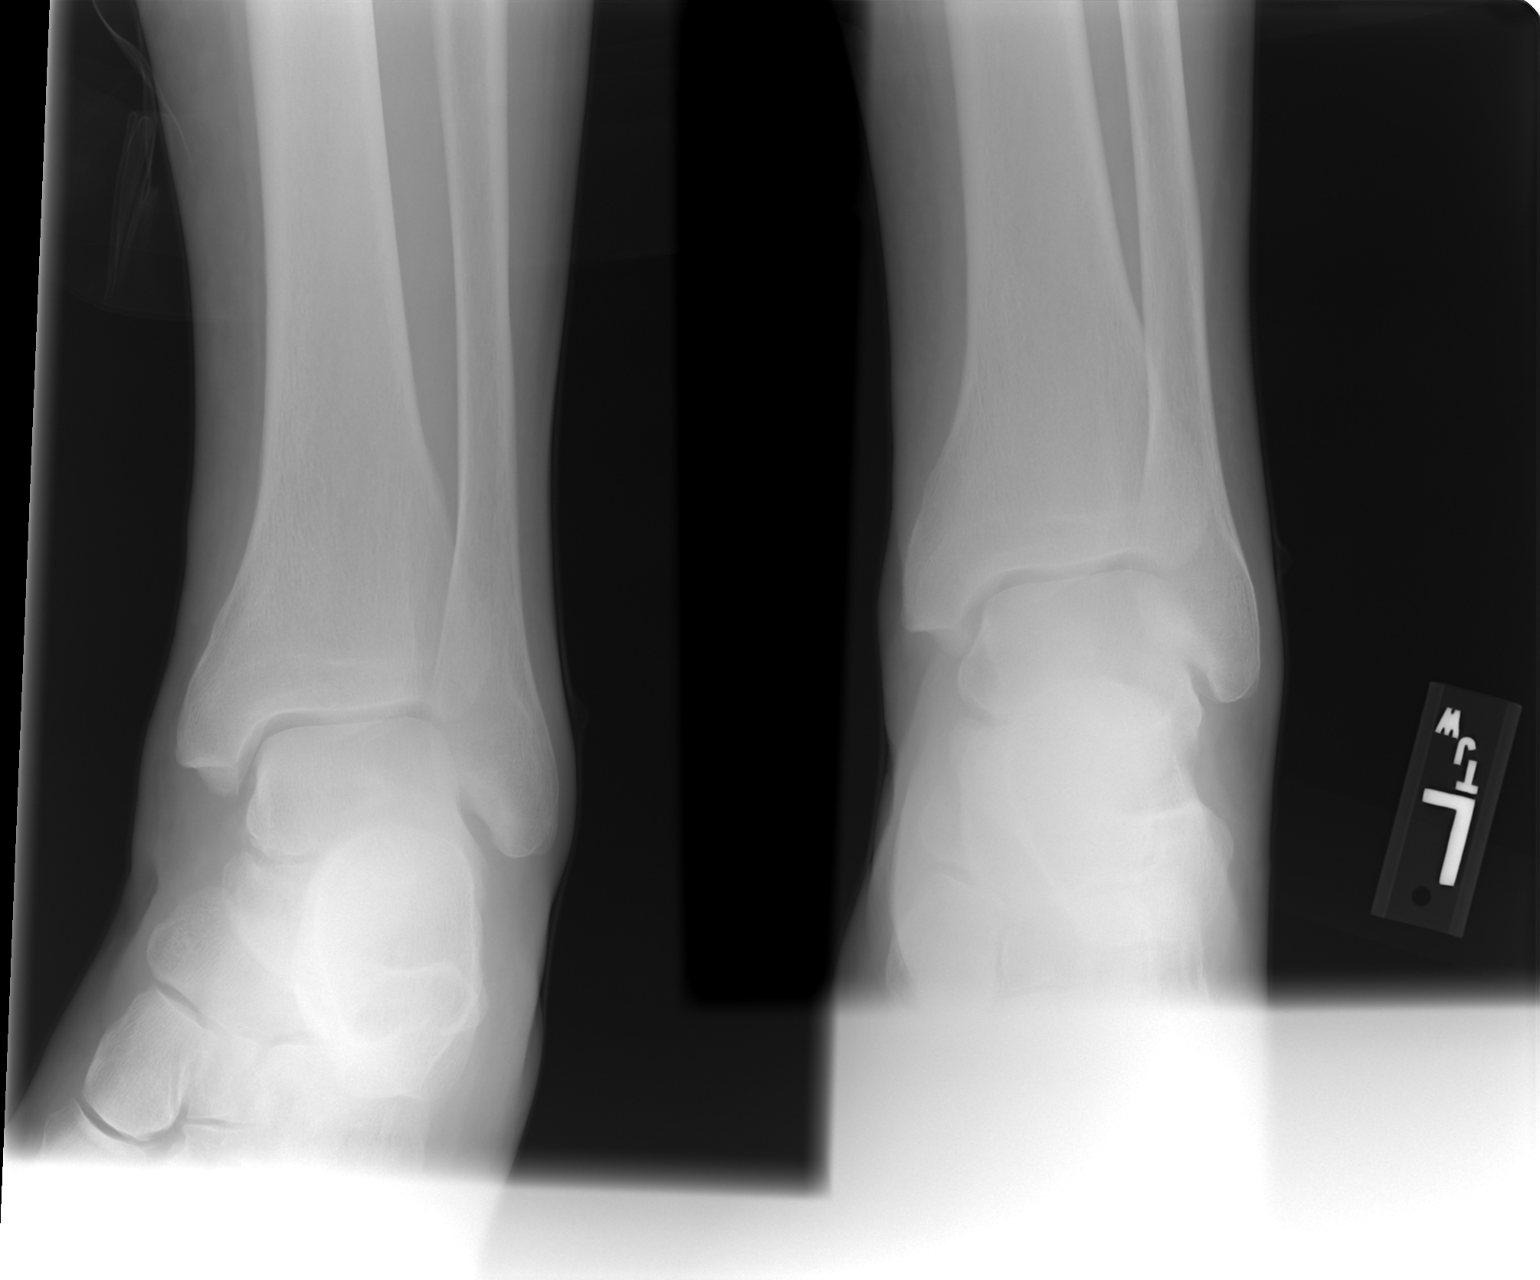

[view not recorded (2 of 2)]
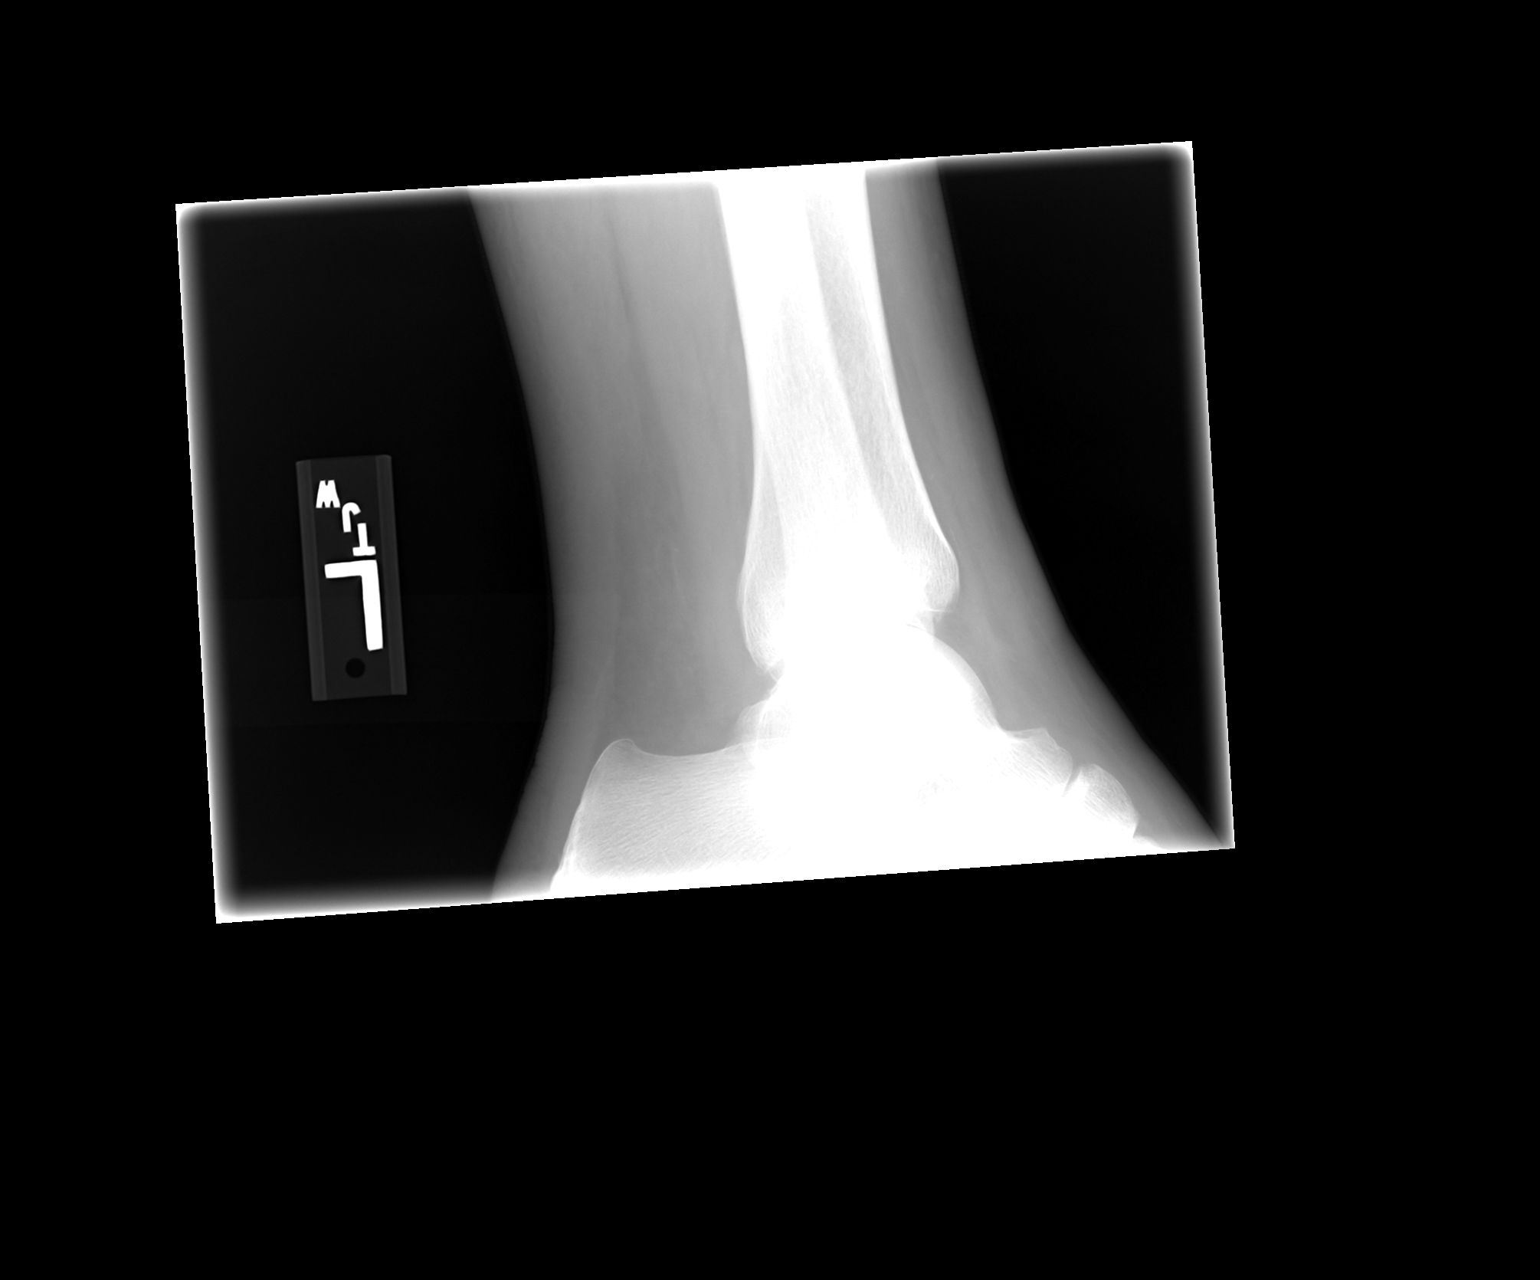

[2 of 2 positions shown; findings below may reference images not displayed]

FINDINGS: There is no evidence of fracture, dislocation, or joint effusion.
There is no evidence of arthropathy or other focal bone abnormality.
Soft tissues are unremarkable.
IMPRESSION: Negative.

## 2015-06-07 ENCOUNTER — Other Ambulatory Visit: Payer: Self-pay | Admitting: Internal Medicine

## 2015-07-06 ENCOUNTER — Other Ambulatory Visit: Payer: Self-pay | Admitting: Internal Medicine

## 2015-10-19 ENCOUNTER — Other Ambulatory Visit: Payer: Self-pay | Admitting: Internal Medicine

## 2015-10-29 ENCOUNTER — Other Ambulatory Visit: Payer: Self-pay

## 2015-10-29 ENCOUNTER — Ambulatory Visit (INDEPENDENT_AMBULATORY_CARE_PROVIDER_SITE_OTHER): Payer: Commercial Managed Care - HMO

## 2015-10-29 ENCOUNTER — Telehealth: Payer: Self-pay | Admitting: *Deleted

## 2015-10-29 DIAGNOSIS — Z23 Encounter for immunization: Secondary | ICD-10-CM

## 2015-10-29 MED ORDER — AMLODIPINE BESYLATE 2.5 MG PO TABS: ORAL_TABLET | ORAL | Status: DC

## 2015-10-29 MED ORDER — MELOXICAM 15 MG PO TABS: 15.0000 mg | ORAL_TABLET | Freq: Every day | ORAL | Status: DC

## 2015-10-29 NOTE — Telephone Encounter (Signed)
Completed his request.

## 2015-10-29 NOTE — Telephone Encounter (Signed)
Patient has requested a medication refill Meloxicam, and amlodipine, before he moves out of town to [place]. Patient will move 11/01/15.

## 2016-05-04 ENCOUNTER — Other Ambulatory Visit: Payer: Self-pay | Admitting: Internal Medicine

## 2016-08-26 ENCOUNTER — Other Ambulatory Visit: Payer: Self-pay

## 2016-08-26 MED ORDER — MELOXICAM 15 MG PO TABS: 15.0000 mg | ORAL_TABLET | Freq: Every day | ORAL | 0 refills | Status: AC

## 2016-08-26 NOTE — Telephone Encounter (Signed)
Pharmacy requesting refill, patient was last seen in 03/2015 by Gilford Rile.  No follow up, she refilled last in July, please advise, thanks

## 2016-08-27 ENCOUNTER — Other Ambulatory Visit: Payer: Self-pay

## 2016-08-27 MED ORDER — AMLODIPINE BESYLATE 2.5 MG PO TABS: ORAL_TABLET | ORAL | 3 refills | Status: AC

## 2020-09-20 DEATH — deceased
